# Patient Record
Sex: Female | Born: 1985 | Race: Black or African American | Hispanic: No | Marital: Single | Smoking: Never smoker
Health system: Southern US, Community
[De-identification: ages and names within clinical notes are randomized; demographics above are authoritative.]

## PROBLEM LIST (undated history)

## (undated) DIAGNOSIS — M79606 Pain in leg, unspecified: Secondary | ICD-10-CM

## (undated) DIAGNOSIS — M7989 Other specified soft tissue disorders: Secondary | ICD-10-CM

## (undated) DIAGNOSIS — I2699 Other pulmonary embolism without acute cor pulmonale: Secondary | ICD-10-CM

---

## 2017-08-10 ENCOUNTER — Emergency Department (HOSPITAL_COMMUNITY)
Admission: EM | Admit: 2017-08-10 | Discharge: 2017-08-10 | Disposition: A | Discharge status: Home / Self Care | Payer: Medicaid Other | Attending: Emergency Medicine | Admitting: Emergency Medicine

## 2017-08-10 ENCOUNTER — Emergency Department (HOSPITAL_BASED_OUTPATIENT_CLINIC_OR_DEPARTMENT_OTHER): Payer: Medicaid Other

## 2017-08-10 ENCOUNTER — Emergency Department (HOSPITAL_COMMUNITY)
Admission: EM | Admit: 2017-08-10 | Discharge: 2017-08-10 | Discharge status: Against Medical Advice | Payer: Medicaid Other | Source: Home / Self Care

## 2017-08-10 ENCOUNTER — Encounter (HOSPITAL_BASED_OUTPATIENT_CLINIC_OR_DEPARTMENT_OTHER): Payer: Self-pay

## 2017-08-10 ENCOUNTER — Encounter (HOSPITAL_COMMUNITY): Payer: Self-pay

## 2017-08-10 DIAGNOSIS — I2699 Other pulmonary embolism without acute cor pulmonale: Secondary | ICD-10-CM | POA: Insufficient documentation

## 2017-08-10 DIAGNOSIS — R2243 Localized swelling, mass and lump, lower limb, bilateral: Secondary | ICD-10-CM | POA: Diagnosis not present

## 2017-08-10 DIAGNOSIS — R0602 Shortness of breath: Secondary | ICD-10-CM | POA: Insufficient documentation

## 2017-08-10 DIAGNOSIS — R0789 Other chest pain: Secondary | ICD-10-CM | POA: Diagnosis present

## 2017-08-10 DIAGNOSIS — Z86711 Personal history of pulmonary embolism: Secondary | ICD-10-CM | POA: Diagnosis not present

## 2017-08-10 HISTORY — DX: Other pulmonary embolism without acute cor pulmonale: I26.99

## 2017-08-10 LAB — CBC WITH DIFFERENTIAL/PLATELET
BASOS ABS: 0 10*3/uL (ref 0.0–0.1)
BASOS PCT: 0 %
EOS ABS: 0.1 10*3/uL (ref 0.0–0.7)
Eosinophils Relative: 1 %
HEMATOCRIT: 39.1 % (ref 36.0–46.0)
HEMOGLOBIN: 13 g/dL (ref 12.0–15.0)
LYMPHS ABS: 1.4 10*3/uL (ref 0.7–4.0)
LYMPHS PCT: 23 %
MCH: 27.5 pg (ref 26.0–34.0)
MCHC: 33.2 g/dL (ref 30.0–36.0)
MCV: 82.7 fL (ref 78.0–100.0)
MONOS PCT: 12 %
Monocytes Absolute: 0.7 10*3/uL (ref 0.1–1.0)
NEUTROS PCT: 64 %
Neutro Abs: 3.9 10*3/uL (ref 1.7–7.7)
Platelets: 165 10*3/uL (ref 150–400)
RBC: 4.73 MIL/uL (ref 3.87–5.11)
RDW: 12.8 % (ref 11.5–15.5)
WBC: 6 10*3/uL (ref 4.0–10.5)

## 2017-08-10 LAB — ANTITHROMBIN III: AntiThromb III Func: 88 % (ref 75–120)

## 2017-08-10 LAB — COMPREHENSIVE METABOLIC PANEL
ALBUMIN: 3.8 g/dL (ref 3.5–5.0)
ALK PHOS: 62 U/L (ref 38–126)
ALT: 18 U/L (ref 14–54)
ANION GAP: 8 (ref 5–15)
AST: 24 U/L (ref 15–41)
BILIRUBIN TOTAL: 0.6 mg/dL (ref 0.3–1.2)
BUN: 9 mg/dL (ref 6–20)
CALCIUM: 9.1 mg/dL (ref 8.9–10.3)
CO2: 24 mmol/L (ref 22–32)
Chloride: 104 mmol/L (ref 101–111)
Creatinine, Ser: 0.63 mg/dL (ref 0.44–1.00)
GFR calc Af Amer: >60 mL/min (ref >60)
GFR calc non Af Amer: >60 mL/min (ref >60)
GLUCOSE: 99 mg/dL (ref 65–99)
Potassium: 3.7 mmol/L (ref 3.5–5.1)
SODIUM: 136 mmol/L (ref 135–145)
TOTAL PROTEIN: 8 g/dL (ref 6.5–8.1)

## 2017-08-10 LAB — TROPONIN I: Troponin I: <0.03 ng/mL (ref <0.03)

## 2017-08-10 MED ORDER — RIVAROXABAN (XARELTO) VTE STARTER PACK (15 & 20 MG): ORAL_TABLET | ORAL | 0 refills | Status: DC

## 2017-08-10 MED ORDER — IOPAMIDOL (ISOVUE-370) INJECTION 76%
100.0000 mL | Freq: Once | INTRAVENOUS | Status: AC | PRN
  Administered 2017-08-10: 100 mL via INTRAVENOUS

## 2017-08-10 MED FILL — XARELTO STARTER PACK: 15 & 20 | 28 days supply | Qty: 51 | Fill #0

## 2017-08-10 NOTE — ED Triage Notes (Signed)
C/o CP-pain worse with dep breath-reports hx of PE

## 2017-08-10 NOTE — Discharge Instructions (Signed)
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 Return for worsening shortness of breath or difficulty breathing. Take blood thinners. Follow-up with primary care and hematology to figure out why you have had recurrent pulmonary embolisms.

## 2017-08-10 NOTE — ED Notes (Signed)
ED Provider at bedside. 

## 2017-08-10 NOTE — ED Triage Notes (Signed)
Pt states that she was seen today at [place] and diagnosed with bilateral PE's and then sent home without blood thinners. Pt unhappy with care and came here.

## 2017-08-10 NOTE — ED Notes (Signed)
Pt ambulated by RN Sue Lush

## 2017-08-10 NOTE — ED Provider Notes (Signed)
[facility]   CSN: [id number] Arrival date & time: 08/10/17  1524     History   Chief Complaint Chief Complaint  Patient presents with  . Chest Pain    HPI Christie Hernandez is a 31 y.o. female.  HPI Patient presents with left anterior chest pain with shortness of breath.Began 2 days ago. More short of breath than normal. Has had some difficulty walking because she gets short of breath. Has history of pulmonary embolisms 2 years ago. States that that was more severe than this. She was on anticoagulation for 6 months and then stop. Did not know why she had the blood clot. No family members have had pulmonary embolism. She does not smoke. She is not on anticoagulation. She had not had recent surgery. She has some swelling in both her legs and states this is not unusual. It is also worse with certain movements. Past Medical History:  Diagnosis Date  . Pulmonary emboli (HCC)     There are no active problems to display for this patient.   History reviewed. No pertinent surgical history.  OB History    No data available       Home Medications    Prior to Admission medications   Medication Sig Start Date End Date Taking? Authorizing Provider  Rivaroxaban 15 & 20 MG TBPK Take as directed on package: Start with one  tablet by mouth twice a day with food. On Day 22, switch to one  tablet once a day with food. 08/10/17   Benjiman Core, MD    Family History No family history on file.  Social History Social History  Substance Use Topics  . Smoking status: Never Smoker  . Smokeless tobacco: Never Used  . Alcohol use No     Allergies   Patient has no known allergies.   Review of Systems Review of Systems  Constitutional: Negative for appetite change.  HENT: Negative for congestion.   Respiratory: Positive for shortness of breath. Negative for cough.   Cardiovascular: Positive for chest pain and leg swelling.  Gastrointestinal:  Negative for abdominal pain.  Endocrine: Negative for polyuria.  Genitourinary: Negative for flank pain.  Musculoskeletal: Negative for back pain.  Neurological: Negative for weakness and numbness.  Hematological: Negative for adenopathy.  Psychiatric/Behavioral: Negative for confusion.     Physical Exam Updated Vital Signs BP (!) 162/99 (BP Location: Right Arm)   Temp 98.3 F (36.8 C) (Oral)   Wt (!) 165.6 kg (365 lb)   LMP 07/19/2017   Physical Exam  Constitutional:  Patient is morbidly obese  HENT:  Head: Atraumatic.  Eyes: Pupils are equal, round, and reactive to light.  Neck: Neck supple.  Cardiovascular: Normal rate.   Pulmonary/Chest: She exhibits tenderness.  Tenderness to left anterior chest wall. No rash or deformity. No crepitance.  Abdominal: There is no tenderness.  Musculoskeletal: She exhibits edema.  Pitting edema bilateral lower extremities. Appears symmetric  Skin: Skin is warm.  Psychiatric: She has a normal mood and affect.     ED Treatments / Results  Labs (all labs ordered are listed, but only abnormal results are displayed) Labs Reviewed  CBC WITH DIFFERENTIAL/PLATELET  COMPREHENSIVE METABOLIC PANEL  TROPONIN I  ANTITHROMBIN III  PROTEIN C ACTIVITY  PROTEIN C, TOTAL  PROTEIN S ACTIVITY  PROTEIN S, TOTAL  LUPUS ANTICOAGULANT PANEL  BETA-2-GLYCOPROTEIN I ABS, IGG/M/A  HOMOCYSTEINE  FACTOR 5 LEIDEN  PROTHROMBIN GENE MUTATION  CARDIOLIPIN ANTIBODIES, IGG, IGM, IGA  EKG  EKG Interpretation  Date/Time:  Thursday August 10 2017 15:38:13 EDT Ventricular Rate:  90 PR Interval:    QRS Duration: 98 QT Interval:  364 QTC Calculation: 446 R Axis:   80 Text Interpretation:  Sinus rhythm Borderline Q waves in inferior leads Borderline T abnormalities, inferior leads [id number] Reconfirmed by Benjiman Core [id number]) on 08/10/2017 3:44:16 PM       Radiology Dg Chest 2 View  Result Date: 08/10/2017 CLINICAL DATA:  Shortness of Breath  EXAM: CHEST  2 VIEW COMPARISON:  None. FINDINGS: There is a small left pleural effusion. Lungs elsewhere are clear. Heart is mildly enlarged with pulmonary vascularity within normal limits. No adenopathy. No bone lesions. IMPRESSION: Small left pleural effusion. No edema or consolidation. Heart mildly enlarged. Electronically Signed   By: Bretta Bang III M.D.   On: 08/10/2017 15:59   Ct Angio Chest Pe W And/or Wo Contrast  Result Date: 08/10/2017 CLINICAL DATA:  31 year old presenting with 1 day history of mid chest pain. Personal history of pulmonary embolism by report. High clinical pretest probability of pulmonary embolism. EXAM: CT ANGIOGRAPHY CHEST WITH CONTRAST TECHNIQUE: Multidetector CT imaging of the chest was performed using the standard protocol during bolus administration of intravenous contrast. Multiplanar CT image reconstructions and MIPs were obtained to evaluate the vascular anatomy. CONTRAST:  100 mL Isovue 370 IV. COMPARISON:  None. FINDINGS: Cardiovascular: Contrast opacification of the pulmonary arteries is moderate. Abundant image noise is present due to body habitus. Overall, the study is of moderate diagnostic quality. Filling defects within the left main pulmonary artery and the proximal left lower lobe pulmonary artery. Filling defect and the right middle lobe pulmonary artery. No visible filling defects elsewhere, though the peripheral pulmonary arteries are not optimally visualized. RV/LV ratio measures 0.9, the upper limits of normal. Heart mildly enlarged. Mild left ventricular hypertrophy. No pericardial effusion. Mediastinum/Nodes: No pathologically enlarged mediastinal, hilar or axillary lymph nodes. No mediastinal masses. Normal-appearing esophagus. Normal-appearing thyroid gland. Lungs/Pleura: Very low lung volumes with atelectasis in the lower lobes, left greater than right and in the lingula. No confluent airspace consolidation. Small left pleural effusion. No right  pleural effusion. Central airways patent. Upper Abdomen: Unremarkable for the early arterial phase of enhancement. Musculoskeletal: Regional skeleton intact without acute or significant osseous abnormality. Review of the MIP images confirms the above findings. IMPRESSION: 1. Acute pulmonary emboli involving the distal left main pulmonary artery, left lower lobe pulmonary artery, and the right middle lobe pulmonary artery. Clot burden is small. 2. No evidence of right heart strain. 3. Low lung volumes with atelectasis in the lower lobes and in the lingula. Small left pleural effusion. No acute cardiopulmonary disease otherwise. I telephoned these results at the time of interpretation on 08/10/2017 at 4:50 pm to Dr. Benjiman Core, who verbally acknowledged these results. Electronically Signed   By: Hulan Saas M.D.   On: 08/10/2017 16:52    Procedures Procedures (including critical care time)  Medications Ordered in ED Medications  iopamidol (ISOVUE-370) 76 % injection 100 mL (100 mLs Intravenous Contrast Given 08/10/17 1619)     Initial Impression / Assessment and Plan / ED Course  I have reviewed the triage vital signs and the nursing notes.  Pertinent labs & imaging results that were available during my care of the patient were reviewed by me and considered in my medical decision making (see chart for details).     Patient with pulmonary embolism. History of same. Not hypoxic. Will ambulate without hypoxia.  Small pulmonary embolisms without strain. Discussed with pharmacy and will start Xarelto. will need follow-up with primary care doctor and hematology. Did not feel that she needs admission at this time and patient states she could not come in because of her child.  Final Clinical Impressions(s) / ED Diagnoses   Final diagnoses:  Other acute pulmonary embolism without acute cor pulmonale (HCC)    New Prescriptions Discharge Medication List as of 08/10/2017  5:16 PM    START  taking these medications   Details  Rivaroxaban 15 & 20 MG TBPK Take as directed on package: Start with one  tablet by mouth twice a day with food. On Day 22, switch to one  tablet once a day with food., Print         Benjiman Core, MD 08/10/17 [phone]

## 2017-08-10 NOTE — ED Notes (Signed)
Pt called in waiting room to go back to E41 no answer. Triage tech called pt also and still no answer.

## 2017-08-11 DIAGNOSIS — I10 Essential (primary) hypertension: Secondary | ICD-10-CM | POA: Insufficient documentation

## 2017-08-11 LAB — LUPUS ANTICOAGULANT PANEL
DRVVT: 40.9 s (ref 0.0–47.0)
PTT LA: 26.6 s (ref 0.0–51.9)

## 2017-08-11 LAB — PROTEIN S ACTIVITY: PROTEIN S ACTIVITY: 44 % — AB (ref 63–140)

## 2017-08-11 LAB — BETA-2-GLYCOPROTEIN I ABS, IGG/M/A
Beta-2 Glyco I IgG: <9 GPI IgG units (ref 0–20)
Beta-2-Glycoprotein I IgA: <9 GPI IgA units (ref 0–25)

## 2017-08-11 LAB — PROTEIN C ACTIVITY: PROTEIN C ACTIVITY: 124 % (ref 73–180)

## 2017-08-11 LAB — PROTEIN S, TOTAL: PROTEIN S AG TOTAL: 66 % (ref 60–150)

## 2017-08-12 LAB — PROTEIN C, TOTAL: Protein C, Total: 115 % (ref 60–150)

## 2017-08-14 ENCOUNTER — Other Ambulatory Visit (HOSPITAL_COMMUNITY)
Admission: RE | Admit: 2017-08-14 | Discharge: 2017-08-14 | Disposition: A | Discharge status: Home / Self Care | Payer: Medicaid Other | Source: Ambulatory Visit | Attending: Obstetrics & Gynecology | Admitting: Obstetrics & Gynecology

## 2017-08-14 ENCOUNTER — Ambulatory Visit (INDEPENDENT_AMBULATORY_CARE_PROVIDER_SITE_OTHER): Payer: Medicaid Other | Admitting: Obstetrics & Gynecology

## 2017-08-14 ENCOUNTER — Encounter: Payer: Self-pay | Admitting: Obstetrics & Gynecology

## 2017-08-14 VITALS — BP 140/84 | HR 70 | Ht 64.0 in | Wt 361.0 lb

## 2017-08-14 DIAGNOSIS — Z0000 Encounter for general adult medical examination without abnormal findings: Secondary | ICD-10-CM

## 2017-08-14 DIAGNOSIS — N898 Other specified noninflammatory disorders of vagina: Secondary | ICD-10-CM | POA: Diagnosis present

## 2017-08-14 DIAGNOSIS — Z124 Encounter for screening for malignant neoplasm of cervix: Secondary | ICD-10-CM | POA: Diagnosis not present

## 2017-08-14 DIAGNOSIS — Z1151 Encounter for screening for human papillomavirus (HPV): Secondary | ICD-10-CM | POA: Diagnosis not present

## 2017-08-14 DIAGNOSIS — Z23 Encounter for immunization: Secondary | ICD-10-CM

## 2017-08-14 DIAGNOSIS — Z113 Encounter for screening for infections with a predominantly sexual mode of transmission: Secondary | ICD-10-CM

## 2017-08-14 LAB — CARDIOLIPIN ANTIBODIES, IGG, IGM, IGA
Anticardiolipin IgA: <9 APL U/mL (ref 0–11)
Anticardiolipin IgM: <9 MPL U/mL (ref 0–12)

## 2017-08-14 LAB — HOMOCYSTEINE: Homocysteine: 6.5 umol/L (ref 0.0–15.0)

## 2017-08-14 NOTE — Progress Notes (Signed)
   Subjective:    Patient ID: Christie Hernandez, female    DOB: 07/26/1986, 31 y.o.   MRN: [id number]  HPI 31 yoS AA P48 (46 year old son) here today for a pap smear and with the issue of vaginal discharge for about a week. It has an odor. She has had BV in the past.  She has been monogamous for about 6 months.   Review of Systems Last pap about 18 months ago in [place] "normal". She had a PE recently and was started on "blood thinner". 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

## 2017-08-14 NOTE — Addendum Note (Signed)
Addended by: Allie Bossier on: 08/14/2017 11:49 AM   Modules accepted: Orders

## 2017-08-14 NOTE — Progress Notes (Signed)
vagina

## 2017-08-15 LAB — FACTOR 5 LEIDEN

## 2017-08-16 LAB — CYTOLOGY - PAP
BACTERIAL VAGINITIS: NEGATIVE
CANDIDA VAGINITIS: POSITIVE — AB
CHLAMYDIA, DNA PROBE: NEGATIVE
DIAGNOSIS: NEGATIVE
Neisseria Gonorrhea: NEGATIVE
Trichomonas: NEGATIVE

## 2017-08-16 LAB — PROTHROMBIN GENE MUTATION

## 2017-08-21 ENCOUNTER — Other Ambulatory Visit: Payer: Self-pay

## 2017-08-23 ENCOUNTER — Telehealth: Payer: Self-pay | Admitting: General Practice

## 2017-08-23 DIAGNOSIS — B379 Candidiasis, unspecified: Secondary | ICD-10-CM

## 2017-08-23 MED ORDER — FLUCONAZOLE 150 MG PO TABS: 150.0000 mg | ORAL_TABLET | Freq: Once | ORAL | 0 refills | Status: DC

## 2017-08-23 NOTE — Telephone Encounter (Signed)
Patient called and left message requesting pap smear results. States she is having pain. Per chart review, pap shows yeast infection. Med ordered per protocol. Called patient, no answer- left message stating we are trying to reach you to return your phone call, please call us back

## 2017-08-24 NOTE — Telephone Encounter (Signed)
Pt aware of results and that medication was sent to her pharmacy

## 2017-08-24 NOTE — Telephone Encounter (Signed)
Called pt and left vm for her to return my call

## 2017-08-29 ENCOUNTER — Emergency Department (HOSPITAL_BASED_OUTPATIENT_CLINIC_OR_DEPARTMENT_OTHER)
Admission: EM | Admit: 2017-08-29 | Discharge: 2017-08-29 | Disposition: A | Discharge status: Home / Self Care | Payer: Medicaid Other | Attending: Emergency Medicine | Admitting: Emergency Medicine

## 2017-08-29 ENCOUNTER — Emergency Department (HOSPITAL_BASED_OUTPATIENT_CLINIC_OR_DEPARTMENT_OTHER): Payer: Medicaid Other

## 2017-08-29 ENCOUNTER — Encounter (HOSPITAL_BASED_OUTPATIENT_CLINIC_OR_DEPARTMENT_OTHER): Payer: Self-pay | Admitting: Emergency Medicine

## 2017-08-29 DIAGNOSIS — I2699 Other pulmonary embolism without acute cor pulmonale: Secondary | ICD-10-CM | POA: Insufficient documentation

## 2017-08-29 DIAGNOSIS — I517 Cardiomegaly: Secondary | ICD-10-CM | POA: Diagnosis not present

## 2017-08-29 DIAGNOSIS — R079 Chest pain, unspecified: Secondary | ICD-10-CM

## 2017-08-29 DIAGNOSIS — Z7901 Long term (current) use of anticoagulants: Secondary | ICD-10-CM | POA: Insufficient documentation

## 2017-08-29 HISTORY — DX: Pain in leg, unspecified: M79.606

## 2017-08-29 HISTORY — DX: Pain in leg, unspecified: M79.89

## 2017-08-29 LAB — CBC WITH DIFFERENTIAL/PLATELET
Basophils Absolute: 0 10*3/uL (ref 0.0–0.1)
Basophils Relative: 0 %
EOS PCT: 1 %
Eosinophils Absolute: 0.1 10*3/uL (ref 0.0–0.7)
HEMATOCRIT: 36.9 % (ref 36.0–46.0)
Hemoglobin: 12.1 g/dL (ref 12.0–15.0)
LYMPHS ABS: 1.1 10*3/uL (ref 0.7–4.0)
LYMPHS PCT: 26 %
MCH: 27.3 pg (ref 26.0–34.0)
MCHC: 32.8 g/dL (ref 30.0–36.0)
MCV: 83.3 fL (ref 78.0–100.0)
MONO ABS: 0.5 10*3/uL (ref 0.1–1.0)
Monocytes Relative: 12 %
Neutro Abs: 2.6 10*3/uL (ref 1.7–7.7)
Neutrophils Relative %: 61 %
PLATELETS: 230 10*3/uL (ref 150–400)
RBC: 4.43 MIL/uL (ref 3.87–5.11)
RDW: 13.1 % (ref 11.5–15.5)
WBC: 4.2 10*3/uL (ref 4.0–10.5)

## 2017-08-29 LAB — BASIC METABOLIC PANEL
Anion gap: 5 (ref 5–15)
BUN: 11 mg/dL (ref 6–20)
CO2: 26 mmol/L (ref 22–32)
Calcium: 8.8 mg/dL — ABNORMAL LOW (ref 8.9–10.3)
Chloride: 106 mmol/L (ref 101–111)
Creatinine, Ser: 0.68 mg/dL (ref 0.44–1.00)
GFR calc Af Amer: >60 mL/min (ref >60)
GLUCOSE: 115 mg/dL — AB (ref 65–99)
POTASSIUM: 3.7 mmol/L (ref 3.5–5.1)
Sodium: 137 mmol/L (ref 135–145)

## 2017-08-29 LAB — TROPONIN I: Troponin I: <0.03 ng/mL (ref <0.03)

## 2017-08-29 NOTE — ED Provider Notes (Signed)
[facility] EMERGENCY DEPARTMENT Provider Note   CSN: [id number] Arrival date & time: 08/29/17  9147     History   Chief Complaint Chief Complaint  Patient presents with  . Chest Pain    HPI Christie Hernandez is a 31 y.o. female.  Patient with previous history of pulmonary emboli, most recent episode diagnosed on 08/10/17, currently taking Xarelto -- presents with two-day history of chest pain and pressure, intermittent mild shortness of breath, with mild generalized headache. Patient states her symptoms are similar to when she was previously diagnosed with PE. She states compliance with her medications and has not missed any doses. No medications take for HA. She has not had any worsening lower extremity swelling. She has not had to stop any activities due to shortness of breath. No fevers or cough. No vomiting or diarrhea. No abdominal pain or urinary symptoms. Patient has not noticed any increased bleeding and states that she feels she is tolerating anticoagulation well. She does not currently have a primary care physician. 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 Patient has never seen a hematologist. Course is constant. Nothing makes symptoms better or worse.      Past Medical History:  Diagnosis Date  . Pain and swelling of lower extremity   . Pulmonary emboli (HCC)     There are no active problems to display for this patient.   History reviewed. No pertinent surgical history.  OB History    No data available       Home Medications    Prior to Admission medications   Medication Sig Start Date End Date Taking? Authorizing Provider  Rivaroxaban 15 & 20 MG TBPK Take as directed on package: Start with one  tablet by mouth twice a day with food. On Day 22, switch to one  tablet once a day with food. 08/10/17   Benjiman Core, MD    Family History No family history on file.  Social History Social History  Substance Use  Topics  . Smoking status: Never Smoker  . Smokeless tobacco: Never Used  . Alcohol use No     Allergies   Patient has no known allergies.   Review of Systems Review of Systems  Constitutional: Negative for fever.  HENT: Negative for rhinorrhea and sore throat.   Eyes: Negative for redness.  Respiratory: Positive for shortness of breath. Negative for cough.   Cardiovascular: Positive for chest pain.  Gastrointestinal: Negative for abdominal pain, diarrhea, nausea and vomiting.  Genitourinary: Negative for dysuria.  Musculoskeletal: Negative for myalgias.  Skin: Negative for rash.  Neurological: Positive for headaches.     Physical Exam Updated Vital Signs BP (!) 152/96   Pulse 60   Temp 97.9 F (36.6 C) (Oral)   Resp (!) 23   Ht  (1.626 m)   Wt (!) 166 kg (366 lb)   LMP 08/29/2017   SpO2 100%   BMI 62.82 kg/m   Physical Exam  Constitutional: She appears well-developed and well-nourished.  HENT:  Head: Normocephalic and atraumatic.  Mouth/Throat: Oropharynx is clear and moist.  Eyes: Conjunctivae are normal. Right eye exhibits no discharge. Left eye exhibits no discharge.  Neck: Normal range of motion. Neck supple.  Cardiovascular: Normal rate, regular rhythm and normal heart sounds.   No murmur heard. Pulmonary/Chest: Effort normal and breath sounds normal. No respiratory distress. She has no wheezes. She has no rales.  Abdominal: Soft. There is no tenderness.  Musculoskeletal: She exhibits  edema.  No clinical s/s of DVT.   Neurological: She is alert.  Skin: Skin is warm and dry.  Psychiatric: Her mood appears anxious.  Nursing note and vitals reviewed.    ED Treatments / Results  Labs (all labs ordered are listed, but only abnormal results are displayed) Labs Reviewed  BASIC METABOLIC PANEL - Abnormal; Notable for the following:       Result Value   Glucose, Bld 115 (*)    Calcium 8.8 (*)    All other components within normal limits  CBC WITH  DIFFERENTIAL/PLATELET  TROPONIN I    EKG  EKG Interpretation  Date/Time:  Tuesday August 29 2017 09:46:46 EDT Ventricular Rate:  67 PR Interval:    QRS Duration: 111 QT Interval:  412 QTC Calculation: 435 R Axis:   79 Text Interpretation:  Sinus rhythm Since last tracing rate slower flipped t waves in diffuse leads resolved from prior likely were rate related Confirmed by Melene Plan [phone]) on 08/29/2017 9:51:07 AM       Radiology Dg Chest 2 View  Result Date: 08/29/2017 CLINICAL DATA:  Pulmonary embolus.  Chest pain. EXAM: CHEST  2 VIEW COMPARISON:  CT 08/10/2017.  Chest x-ray 08/10/2017 . FINDINGS: Mediastinum and hilar structures normal. Cardiomegaly with normal pulmonary vascularity. No focal infiltrate . Small left pleural effusion cannot be excluded. No pneumothorax . IMPRESSION: 1. Cardiomegaly.  Normal pulmonary vascularity. 2. Small left pleural effusion cannot be excluded. Electronically Signed   By: Maisie Fus  Register   On: 08/29/2017 11:12    Procedures Procedures (including critical care time)  Medications Ordered in ED Medications - No data to display   Initial Impression / Assessment and Plan / ED Course  I have reviewed the triage vital signs and the nursing notes.  Pertinent labs & imaging results that were available during my care of the patient were reviewed by me and considered in my medical decision making (see chart for details).     Patient seen and examined. Work-up initiated.   Vital signs reviewed and are as follows: BP (!) 152/96   Pulse 60   Temp 97.9 F (36.6 C) (Oral)   Resp (!) 23   Ht  (1.626 m)   Wt (!) 166 kg (366 lb)   LMP 08/29/2017   SpO2 100%   BMI 62.82 kg/m   11:50 AM Patient updated on results. Encouraged her to continue taking anticoagulation as prescribed. She is given primary care referral as well as hematology/oncology referral. She is encouraged to follow-up with both.  Patient was counseled to return with  severe chest pain, especially if the pain is crushing or pressure-like and spreads to the arms, back, neck, or jaw, or if they have sweating, nausea, or shortness of breath with the pain. They were encouraged to call 911 with these symptoms.    Final Clinical Impressions(s) / ED Diagnoses   Final diagnoses:  Chest pain, unspecified type  Other pulmonary embolism without acute cor pulmonale, unspecified chronicity (HCC)  Cardiomegaly   Patient with nonspecific chest pain in setting of recent PE. Patient is on anticoagulation without missing doses. She has normal vital signs here today. She has an EKG with signs of heart strain, although unchanged from previous. Chest x-ray without significant pulmonary edema. Patient informed of cardiomegaly and encouraged follow-up with PCP for echocardiography. Troponin is negative. Patient is not hypoxic. Strongly encouraged establishing routine PCP care and she is also given referral to hematology given recurrent PE with abnormal  protein S test for long-term management recommendations. I do not suspect ACS. I do not suspect worsening PE. Patient appears well but anxious.  New Prescriptions New Prescriptions   No medications on file     Renne Crigler, Cordelia Poche 08/29/17 1153    Melene Plan, DO 08/29/17 1325

## 2017-08-29 NOTE — ED Triage Notes (Signed)
Pt states she is having new onset left sided chest pressure.  Pt recently diagnosed with PE and is taking xarelto.  Pt states she doesn't wish to see her physician again, she is looking for a new MD.  No sob, no new leg pain, pt has some leg pain and swelling chronic.

## 2017-08-29 NOTE — Discharge Instructions (Signed)
Please read and follow all provided instructions.  Your diagnoses today include:  1. Chest pain, unspecified type   2. Other pulmonary embolism without acute cor pulmonale, unspecified chronicity (HCC)   3. Cardiomegaly     Tests performed today include:  An EKG of your heart  A chest x-ray - shows possible enlarged heart, no significant fluid in lungs  Cardiac enzymes - a blood test for heart muscle damage, no stress on heart  Blood counts and electrolytes  Vital signs. See below for your results today.   Medications prescribed:   None  Take any prescribed medications only as directed.  Follow-up instructions: Please follow-up with your primary care provider as soon as you can for further evaluation of your symptoms.   Also see the hematologist (blood clot doctor) listed for evaluation of your recurrent PE's for long-term treatment decisions.  Return instructions:  SEEK IMMEDIATE MEDICAL ATTENTION IF:  You have severe chest pain, especially if the pain is crushing or pressure-like and spreads to the arms, back, neck, or jaw, or if you have sweating, nausea (feeling sick to your stomach), or shortness of breath. THIS IS AN EMERGENCY. Don't wait to see if the pain will go away. Get medical help at once. Call 911 or 0 (operator). DO NOT drive yourself to the hospital.   Your chest pain gets worse and does not go away with rest.   You have an attack of chest pain lasting longer than usual, despite rest and treatment with the medications your caregiver has prescribed.   You wake from sleep with chest pain or shortness of breath.  You feel dizzy or faint.  You have chest pain not typical of your usual pain for which you originally saw your caregiver.   You have any other emergent concerns regarding your health.  Additional Information: Chest pain comes from many different causes. Your caregiver has diagnosed you as having chest pain that is not specific for one problem,  but does not require admission.  You are at low risk for an acute heart condition or other serious illness.   Your vital signs today were: BP (!) 152/96    Pulse 60    Temp 97.9 F (36.6 C) (Oral)    Resp (!) 23    Ht  (1.626 m)    Wt (!) 166 kg (366 lb)    LMP 08/29/2017    SpO2 100%    BMI 62.82 kg/m  If your blood pressure (BP) was elevated above 135/85 this visit, please have this repeated by your doctor within one month. --------------

## 2017-08-29 NOTE — ED Notes (Signed)
Pt given resource guide and work note

## 2017-09-26 ENCOUNTER — Other Ambulatory Visit: Payer: Self-pay | Admitting: Obstetrics & Gynecology

## 2017-09-26 DIAGNOSIS — B379 Candidiasis, unspecified: Secondary | ICD-10-CM

## 2017-12-05 ENCOUNTER — Other Ambulatory Visit: Payer: Self-pay

## 2017-12-05 ENCOUNTER — Emergency Department (HOSPITAL_BASED_OUTPATIENT_CLINIC_OR_DEPARTMENT_OTHER): Payer: Medicaid Other

## 2017-12-05 ENCOUNTER — Emergency Department (HOSPITAL_BASED_OUTPATIENT_CLINIC_OR_DEPARTMENT_OTHER)
Admission: EM | Admit: 2017-12-05 | Discharge: 2017-12-05 | Disposition: A | Discharge status: Home / Self Care | Payer: Medicaid Other | Attending: Emergency Medicine | Admitting: Emergency Medicine

## 2017-12-05 ENCOUNTER — Encounter (HOSPITAL_BASED_OUTPATIENT_CLINIC_OR_DEPARTMENT_OTHER): Payer: Self-pay

## 2017-12-05 DIAGNOSIS — R0789 Other chest pain: Secondary | ICD-10-CM | POA: Diagnosis not present

## 2017-12-05 DIAGNOSIS — Z86711 Personal history of pulmonary embolism: Secondary | ICD-10-CM | POA: Diagnosis not present

## 2017-12-05 DIAGNOSIS — R079 Chest pain, unspecified: Secondary | ICD-10-CM

## 2017-12-05 DIAGNOSIS — Z7901 Long term (current) use of anticoagulants: Secondary | ICD-10-CM | POA: Diagnosis not present

## 2017-12-05 LAB — CBC WITH DIFFERENTIAL/PLATELET
Basophils Absolute: 0 10*3/uL (ref 0.0–0.1)
Basophils Relative: 0 %
Eosinophils Absolute: 0.1 10*3/uL (ref 0.0–0.7)
Eosinophils Relative: 1 %
HEMATOCRIT: 38.4 % (ref 36.0–46.0)
HEMOGLOBIN: 12.6 g/dL (ref 12.0–15.0)
LYMPHS ABS: 1.4 10*3/uL (ref 0.7–4.0)
Lymphocytes Relative: 25 %
MCH: 27.1 pg (ref 26.0–34.0)
MCHC: 32.8 g/dL (ref 30.0–36.0)
MCV: 82.6 fL (ref 78.0–100.0)
MONOS PCT: 9 %
Monocytes Absolute: 0.5 10*3/uL (ref 0.1–1.0)
NEUTROS ABS: 3.7 10*3/uL (ref 1.7–7.7)
Neutrophils Relative %: 65 %
Platelets: 233 10*3/uL (ref 150–400)
RBC: 4.65 MIL/uL (ref 3.87–5.11)
RDW: 13.7 % (ref 11.5–15.5)
WBC: 5.7 10*3/uL (ref 4.0–10.5)

## 2017-12-05 LAB — COMPREHENSIVE METABOLIC PANEL
ALBUMIN: 3.7 g/dL (ref 3.5–5.0)
ALK PHOS: 59 U/L (ref 38–126)
ALT: 15 U/L (ref 14–54)
ANION GAP: 9 (ref 5–15)
AST: 23 U/L (ref 15–41)
BILIRUBIN TOTAL: 0.5 mg/dL (ref 0.3–1.2)
BUN: 15 mg/dL (ref 6–20)
CALCIUM: 9.1 mg/dL (ref 8.9–10.3)
CO2: 26 mmol/L (ref 22–32)
Chloride: 102 mmol/L (ref 101–111)
Creatinine, Ser: 0.63 mg/dL (ref 0.44–1.00)
GFR calc non Af Amer: >60 mL/min (ref >60)
GLUCOSE: 103 mg/dL — AB (ref 65–99)
Potassium: 3.3 mmol/L — ABNORMAL LOW (ref 3.5–5.1)
Sodium: 137 mmol/L (ref 135–145)
TOTAL PROTEIN: 8.5 g/dL — AB (ref 6.5–8.1)

## 2017-12-05 LAB — TROPONIN I: Troponin I: <0.03 ng/mL (ref <0.03)

## 2017-12-05 MED ORDER — IOPAMIDOL (ISOVUE-370) INJECTION 76%
125.0000 mL | Freq: Once | INTRAVENOUS | Status: AC | PRN
  Administered 2017-12-05: 125 mL via INTRAVENOUS

## 2017-12-05 NOTE — ED Provider Notes (Addendum)
[facility] EMERGENCY DEPARTMENT Provider Note   CSN: [id number] Arrival date & time: 12/05/17  1943     History   Chief Complaint Chief Complaint  Patient presents with  . Chest Pain    HPI Christie Hernandez is a 32 y.o. female.  Complaint is chest pain.  History of PE.  HPI 31-year female.  History of previous PE x2.  Protein C deficiency.  Compliant with Xarelto per her report.  Morbid obesity.  SPE was September of last year.  Continues on blood thinners.  States she is compliant.  Developed some left-sided chest pain early this morning.  Sharp.  "Feels like my blood clot".  Is not had cough, fever, or other systemic symptoms.  Past Medical History:  Diagnosis Date  . Pain and swelling of lower extremity   . Pulmonary emboli (HCC)     There are no active problems to display for this patient.   History reviewed. No pertinent surgical history.  OB History    No data available       Home Medications    Prior to Admission medications   Medication Sig Start Date End Date Taking? Authorizing Provider  Apixaban (ELIQUIS PO) Take by mouth.   Yes [provider]    Family History No family history on file.  Social History Social History   Tobacco Use  . Smoking status: Never Smoker  . Smokeless tobacco: Never Used  Substance Use Topics  . Alcohol use: No  . Drug use: No     Allergies   Patient has no known allergies.   Review of Systems Review of Systems  Constitutional: Negative for appetite change, chills, diaphoresis, fatigue and fever.  HENT: Negative for mouth sores, sore throat and trouble swallowing.   Eyes: Negative for visual disturbance.  Respiratory: Negative for cough, chest tightness, shortness of breath and wheezing.   Cardiovascular: Positive for chest pain.  Gastrointestinal: Negative for abdominal distention, abdominal pain, diarrhea, nausea and vomiting.  Endocrine: Negative for polydipsia, polyphagia and  polyuria.  Genitourinary: Negative for dysuria, frequency and hematuria.  Musculoskeletal: Negative for gait problem.  Skin: Negative for color change, pallor and rash.  Neurological: Negative for dizziness, syncope, light-headedness and headaches.  Hematological: Does not bruise/bleed easily.  Psychiatric/Behavioral: Negative for behavioral problems and confusion.     Physical Exam Updated Vital Signs BP (!) 155/99   Pulse 84   Temp 98.6 F (37 C) (Oral)   Resp (!) 23   Ht 5\' 4"  (1.626 m)   Wt (!) 165.3 kg (364 lb 6.7 oz)   LMP 11/08/2017   SpO2 100%   BMI 62.55 kg/m   Physical Exam  Constitutional: She is oriented to person, place, and time. She appears well-developed and well-nourished. No distress.  Obese. NAD.  HENT:  Head: Normocephalic.  Eyes: Pupils are equal, round, and reactive to light. Conjunctivae are normal. No scleral icterus.  Neck: Normal range of motion. Neck supple. No thyromegaly present.  Cardiovascular: Normal rate and regular rhythm. Exam reveals no gallop and no friction rub.  No murmur heard. Not tachycardic.  No JVD or bruits.  Pulmonary/Chest: Effort normal and breath sounds normal. No respiratory distress. She has no wheezes. She has no rales.  Abdominal: Soft. Bowel sounds are normal. She exhibits no distension. There is no tenderness. There is no rebound.  Musculoskeletal: Normal range of motion.  Neurological: She is alert and oriented to person, place, and time.  Skin: Skin is warm and dry.  No rash noted.  Psychiatric: She has a normal mood and affect. Her behavior is normal.     ED Treatments / Results  Labs (all labs ordered are listed, but only abnormal results are displayed) Labs Reviewed  COMPREHENSIVE METABOLIC PANEL - Abnormal; Notable for the following components:      Result Value   Potassium 3.3 (*)    Glucose, Bld 103 (*)    Total Protein 8.5 (*)    All other components within normal limits  TROPONIN I  CBC WITH  DIFFERENTIAL/PLATELET  PREGNANCY, URINE    EKG  EKG Interpretation None       Radiology Dg Chest 2 View  Result Date: 12/05/2017 CLINICAL DATA:  Chest pain and shortness of breath EXAM: CHEST  2 VIEW COMPARISON:  August 29, 2017 FINDINGS: The lungs are clear. Heart is upper normal in size with pulmonary vascularity within normal limits. No adenopathy. No evident bone lesions. No pneumothorax. IMPRESSION: No edema or consolidation. Electronically Signed   By: Bretta Bang III M.D.   On: 12/05/2017 20:29   Ct Angio Chest Pe W And/or Wo Contrast  Result Date: 12/05/2017 CLINICAL DATA:  Sternal chest pain with shortness of breath EXAM: CT ANGIOGRAPHY CHEST WITH CONTRAST TECHNIQUE: Multidetector CT imaging of the chest was performed using the standard protocol during bolus administration of intravenous contrast. Multiplanar CT image reconstructions and MIPs were obtained to evaluate the vascular anatomy. CONTRAST:  ISOVUE-370 IOPAMIDOL (ISOVUE-370) INJECTION 76% COMPARISON:  Radiograph 12/05/2017, CT chest 08/11/2007 FINDINGS: Cardiovascular: Satisfactory opacification of the pulmonary arteries to the segmental level. No evidence of pulmonary embolism. Normal heart size. No pericardial effusion. Nonaneurysmal aorta. Mediastinum/Nodes: No enlarged mediastinal, hilar, or axillary lymph nodes. Thyroid gland, trachea, and esophagus demonstrate no significant findings. Stable mildly prominent axillary lymph nodes. Lungs/Pleura: Lungs are clear. No pleural effusion or pneumothorax. Upper Abdomen: No acute abnormality. Musculoskeletal: No chest wall abnormality. No acute or significant osseous findings. Review of the MIP images confirms the above findings. IMPRESSION: 1. Negative for acute pulmonary embolus 2. Clear lung fields Electronically Signed   By: Jasmine Pang M.D.   On: 12/05/2017 22:02    Procedures Procedures (including critical care time)  Medications Ordered in ED Medications    iopamidol (ISOVUE-370) 76 % injection 125 mL (125 mLs Intravenous Contrast Given 12/05/17 2134)     Initial Impression / Assessment and Plan / ED Course  I have reviewed the triage vital signs and the nursing notes.  Pertinent labs & imaging results that were available during my care of the patient were reviewed by me and considered in my medical decision making (see chart for details).     CT negative.  Plan Tylenol for pain.  Discharge home.  Normal EKG.  Normal troponin.  No concerning vital signs.  Final Clinical Impressions(s) / ED Diagnoses   Final diagnoses:  Chest pain, unspecified type    ED Discharge Orders    None       Rolland Porter, MD 12/05/17 2352    Rolland Porter, MD 12/16/17 1610    Rolland Porter, MD 02/01/18 1537

## 2017-12-05 NOTE — Discharge Instructions (Signed)
Tylenol as needed for chest pain. Follow up with your primary care physician. Continue your blood thinner at your current dosage. Your CT scan today does not show any sign of old or new blood clots.

## 2017-12-05 NOTE — ED Triage Notes (Signed)
C/o CP x today-states feels same as when had PE 3 months ago-NAD-steady gait

## 2018-04-09 ENCOUNTER — Emergency Department (HOSPITAL_BASED_OUTPATIENT_CLINIC_OR_DEPARTMENT_OTHER): Payer: Medicaid Other

## 2018-04-09 ENCOUNTER — Encounter (HOSPITAL_BASED_OUTPATIENT_CLINIC_OR_DEPARTMENT_OTHER): Payer: Self-pay | Admitting: *Deleted

## 2018-04-09 ENCOUNTER — Emergency Department (HOSPITAL_BASED_OUTPATIENT_CLINIC_OR_DEPARTMENT_OTHER)
Admission: EM | Admit: 2018-04-09 | Discharge: 2018-04-10 | Disposition: A | Discharge status: Home / Self Care | Payer: Medicaid Other | Attending: Emergency Medicine | Admitting: Emergency Medicine

## 2018-04-09 ENCOUNTER — Other Ambulatory Visit: Payer: Self-pay

## 2018-04-09 DIAGNOSIS — M79604 Pain in right leg: Secondary | ICD-10-CM | POA: Diagnosis not present

## 2018-04-09 DIAGNOSIS — M79605 Pain in left leg: Secondary | ICD-10-CM | POA: Diagnosis not present

## 2018-04-09 DIAGNOSIS — Z7901 Long term (current) use of anticoagulants: Secondary | ICD-10-CM | POA: Diagnosis not present

## 2018-04-09 NOTE — ED Provider Notes (Signed)
[facility] Provider Note: [name], MD, FACEP  CSN: [id number] MRN: [id number] ARRIVAL: 04/09/18 at 2223 ROOM: MH10/MH10   CHIEF COMPLAINT  Leg Pain   HISTORY OF PRESENT ILLNESS  04/09/18 11:39 PM Christie Hernandez is a 32 y.o. 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  She recently had a car trip to [place] about a week ago.  She is here with a 2-day history of pain in her lower legs.  The pain is both anterior and posterior and includes the popliteal fossae.  She rates her pain as a 7 out of 10, worse with movement or palpation.  She has chronic lower extremity edema and this has not significantly changed.  She denies chest pain or shortness of breath.  She states she has been compliant with her Eliquis.  She was sent here by her doctor for evaluation of DVT.   Past Medical History:  Diagnosis Date  . Pain and swelling of lower extremity   . Pulmonary emboli (HCC)     History reviewed. No pertinent surgical history.  History reviewed. No pertinent family history.  Social History   Tobacco Use  . Smoking status: Never Smoker  . Smokeless tobacco: Never Used  Substance Use Topics  . Alcohol use: No  . Drug use: No    Prior to Admission medications   Medication Sig Start Date End Date Taking? Authorizing Provider  Apixaban (ELIQUIS PO) Take by mouth.    [provider]    Allergies Patient has no known allergies.   REVIEW OF SYSTEMS  Negative except as noted here or in the History of Present Illness.   PHYSICAL EXAMINATION  Initial Vital Signs Blood pressure (!) 166/85, pulse 86, temperature 98 F (36.7 C), temperature source Oral, resp. rate 18, height  (1.626 m), weight (!) 165.1 kg (364 lb), last menstrual period 03/26/2018, SpO2 99 %.  Examination General: Well-developed, obese female in no acute distress; appearance consistent with age of record HENT: normocephalic; atraumatic Eyes: pupils equal, round  and reactive to light; extraocular muscles intact Neck: supple Heart: regular rate and rhythm Lungs: clear to auscultation bilaterally Abdomen: soft; nondistended; nontender; bowel sounds present Extremities: No deformity; full range of motion; pulses normal; edema of lower legs and feet Neurologic: Awake, alert and oriented; motor function intact in all extremities and symmetric; no facial droop Skin: Warm and dry Psychiatric: Normal mood and affect   RESULTS  Summary of this visit's results, reviewed by myself:   EKG Interpretation  Date/Time:    Ventricular Rate:    PR Interval:    QRS Duration:   QT Interval:    QTC Calculation:   R Axis:     Text Interpretation:        Laboratory Studies: No results found for this or any previous visit (from the past 24 hour(s)). Imaging Studies: US Venous Img Lower Bilateral  Result Date: 04/10/2018 CLINICAL DATA:  Bilateral lower extremity pain after a long car ride 2 days ago. Protein C deficiency. EXAM: BILATERAL LOWER EXTREMITY VENOUS DOPPLER ULTRASOUND TECHNIQUE: Gray-scale sonography with graded compression, as well as color Doppler and duplex ultrasound were performed to evaluate the lower extremity deep venous systems from the level of the common femoral vein and including the common femoral, femoral, profunda femoral, popliteal and calf veins including the posterior tibial, peroneal and gastrocnemius veins when visible. The superficial great saphenous vein was also interrogated. Spectral Doppler was utilized to evaluate flow at rest and  with distal augmentation maneuvers in the common femoral, femoral and popliteal veins. COMPARISON:  None. FINDINGS: RIGHT LOWER EXTREMITY Common Femoral Vein: No evidence of thrombus. Normal compressibility, respiratory phasicity and response to augmentation. Saphenofemoral Junction: No evidence of thrombus. Normal compressibility and flow on color Doppler imaging. Profunda Femoral Vein: No evidence of  thrombus. Normal compressibility and flow on color Doppler imaging. Femoral Vein: No evidence of thrombus. Normal compressibility, respiratory phasicity and response to augmentation. Popliteal Vein: No evidence of thrombus. Normal compressibility, respiratory phasicity and response to augmentation. Calf Veins: No evidence of thrombus. Normal compressibility and flow on color Doppler imaging. Superficial Great Saphenous Vein: No evidence of thrombus. Normal compressibility. Venous Reflux:  None. Other Findings:  None. LEFT LOWER EXTREMITY Common Femoral Vein: No evidence of thrombus. Normal compressibility, respiratory phasicity and response to augmentation. Saphenofemoral Junction: No evidence of thrombus. Normal compressibility and flow on color Doppler imaging. Profunda Femoral Vein: No evidence of thrombus. Normal compressibility and flow on color Doppler imaging. Femoral Vein: No evidence of thrombus. Normal compressibility, respiratory phasicity and response to augmentation. Popliteal Vein: No evidence of thrombus. Normal compressibility, respiratory phasicity and response to augmentation. Calf Veins: No evidence of thrombus. Normal compressibility and flow on color Doppler imaging. Superficial Great Saphenous Vein: No evidence of thrombus. Normal compressibility. Venous Reflux:  None. Other Findings:  None. IMPRESSION: No evidence of deep venous thrombosis. Electronically Signed   By: Burman Nieves M.D.   On: 04/10/2018 01:00    ED COURSE and MDM  Nursing notes and initial vitals signs, including pulse oximetry, reviewed.  Vitals:   04/09/18 2228 04/09/18 2229  BP:  (!) 166/85  Pulse:  86  Resp:  18  Temp:  98 F (36.7 C)  TempSrc:  Oral  SpO2:  99%  Weight: (!) 165.1 kg (364 lb)   Height:  (1.626 m)    Patient advised of negative Doppler studies.  As noted above she is already on Eliquis for treatment of thromboembolic disease.  PROCEDURES    ED DIAGNOSES     ICD-10-CM   1.  Bilateral leg pain M79.604    M79.605        Paula Libra, MD 04/10/18 [phone]

## 2018-04-09 NOTE — ED Triage Notes (Signed)
Pt c/o bil leg pain x 2 days, pt states swelling is normal

## 2018-04-09 NOTE — ED Notes (Signed)
Patient transported to US 

## 2018-09-01 ENCOUNTER — Other Ambulatory Visit: Payer: Self-pay | Admitting: Obstetrics & Gynecology

## 2018-09-01 DIAGNOSIS — B379 Candidiasis, unspecified: Secondary | ICD-10-CM

## 2022-01-31 ENCOUNTER — Encounter (HOSPITAL_COMMUNITY): Payer: Self-pay | Admitting: Obstetrics & Gynecology

## 2022-01-31 ENCOUNTER — Inpatient Hospital Stay (HOSPITAL_COMMUNITY)
Admission: AD | Admit: 2022-01-31 | Discharge: 2022-01-31 | Disposition: A | Discharge status: Home / Self Care | Payer: Medicaid Other | Attending: Family Medicine | Admitting: Family Medicine

## 2022-01-31 ENCOUNTER — Inpatient Hospital Stay (HOSPITAL_COMMUNITY): Payer: Medicaid Other

## 2022-01-31 DIAGNOSIS — Z86711 Personal history of pulmonary embolism: Secondary | ICD-10-CM | POA: Insufficient documentation

## 2022-01-31 DIAGNOSIS — O200 Threatened abortion: Secondary | ICD-10-CM

## 2022-01-31 DIAGNOSIS — O0931 Supervision of pregnancy with insufficient antenatal care, first trimester: Secondary | ICD-10-CM | POA: Diagnosis not present

## 2022-01-31 DIAGNOSIS — N939 Abnormal uterine and vaginal bleeding, unspecified: Secondary | ICD-10-CM

## 2022-01-31 DIAGNOSIS — Z3A01 Less than 8 weeks gestation of pregnancy: Secondary | ICD-10-CM | POA: Diagnosis not present

## 2022-01-31 DIAGNOSIS — Z7901 Long term (current) use of anticoagulants: Secondary | ICD-10-CM | POA: Insufficient documentation

## 2022-01-31 LAB — CBC
HCT: 32.2 % — ABNORMAL LOW (ref 36.0–46.0)
Hemoglobin: 9.6 g/dL — ABNORMAL LOW (ref 12.0–15.0)
MCH: 22.5 pg — ABNORMAL LOW (ref 26.0–34.0)
MCHC: 29.8 g/dL — ABNORMAL LOW (ref 30.0–36.0)
MCV: 75.6 fL — ABNORMAL LOW (ref 80.0–100.0)
Platelets: 266 10*3/uL (ref 150–400)
RBC: 4.26 MIL/uL (ref 3.87–5.11)
RDW: 15.6 % — ABNORMAL HIGH (ref 11.5–15.5)
WBC: 4.7 10*3/uL (ref 4.0–10.5)
nRBC: 0 % (ref 0.0–0.2)

## 2022-01-31 LAB — HCG, QUANTITATIVE, PREGNANCY: hCG, Beta Chain, Quant, S: 167 m[IU]/mL — ABNORMAL HIGH (ref <5)

## 2022-01-31 LAB — URINALYSIS, ROUTINE W REFLEX MICROSCOPIC
Bacteria, UA: NONE SEEN
Bilirubin Urine: NEGATIVE
Glucose, UA: NEGATIVE mg/dL
Ketones, ur: NEGATIVE mg/dL
Nitrite: NEGATIVE
Protein, ur: NEGATIVE mg/dL
RBC / HPF: >50 RBC/hpf — ABNORMAL HIGH (ref 0–5)
Specific Gravity, Urine: 1.033 — ABNORMAL HIGH (ref 1.005–1.030)
pH: 5 (ref 5.0–8.0)

## 2022-01-31 LAB — ABO/RH: ABO/RH(D): B POS

## 2022-01-31 LAB — WET PREP, GENITAL
Clue Cells Wet Prep HPF POC: NONE SEEN
Sperm: NONE SEEN
Trich, Wet Prep: NONE SEEN
WBC, Wet Prep HPF POC: >=10 — AB (ref <10)
Yeast Wet Prep HPF POC: NONE SEEN

## 2022-01-31 LAB — POCT PREGNANCY, URINE: Preg Test, Ur: POSITIVE — AB

## 2022-01-31 MED ORDER — ENOXAPARIN SODIUM 120 MG/0.8ML IJ SOSY: 120.0000 mg | PREFILLED_SYRINGE | Freq: Two times a day (BID) | INTRAMUSCULAR | 60 refills | Status: DC

## 2022-01-31 NOTE — MAU Provider Note (Signed)
?History  ?  ? ?CSN: [id number] ? ?Arrival date and time: 01/31/22 1244 ? ? None  ?  ?Chief Complaint  ?Patient presents with  ? Vaginal Bleeding  ? Abdominal Pain  ? Possible Pregnancy  ? ?HPI ?Christie Hernandez is a 36 y.o. G2P0101 at [redacted]w[redacted]d by LMP who presents to MAU for vaginal bleeding and lower abdominal cramping. Patient reports symptoms started Saturday evening, however worsened yesterday. She reports bleeding initially started out as spotting, but got heavier yesterday, having to change maxi pad every 3-4 hours. She reports she passed several large clots. Bleeding has since lessened, however cramping remains constant and nonstop. She has taken Tylenol without relief. She reports she "feels different". Feels like she is "no longer pregnant" as nausea has improved. LMP was 12/15/2021.  ? ?Patient has not yet established prenatal care.  ? ?Patient has a history of pulmonary embolism, currently on Eliquis.  ? ?OB History   ? ? Gravida  ?2  ? Para  ?1  ? Term  ?0  ? Preterm  ?1  ? AB  ?   ? Living  ?1  ?  ? ? SAB  ?   ? IAB  ?   ? Ectopic  ?   ? Multiple  ?   ? Live Births  ?1  ?   ?  ?  ? ? ?Past Medical History:  ?Diagnosis Date  ? Pain and swelling of lower extremity   ? Pulmonary emboli (HCC)   ? ? ?Past Surgical History:  ?Procedure Laterality Date  ? CESAREAN SECTION    ? ? ?History reviewed. No pertinent family history. ? ?Social History  ? ?Tobacco Use  ? Smoking status: Never  ? Smokeless tobacco: Never  ?Substance Use Topics  ? Alcohol use: No  ? Drug use: No  ? ? ?Allergies: No Known Allergies ? ?Medications Prior to Admission  ?Medication Sig Dispense Refill Last Dose  ? Prenatal Vit-Fe Fumarate-FA (PRENATAL MULTIVITAMIN) TABS tablet Take 1 tablet by mouth daily at 12 noon.   01/30/2022  ? Apixaban (ELIQUIS PO) Take by mouth.     ? ? ?Review of Systems  ?Constitutional: Negative.   ?Respiratory: Negative.    ?Cardiovascular: Negative.   ?Gastrointestinal:  Positive for abdominal pain (cramping).   ?Genitourinary:  Positive for vaginal bleeding.  ?Musculoskeletal: Negative.   ?Neurological: Negative.   ?Physical Exam  ? ?Blood pressure 139/74, pulse (!) 59, temperature 98.3 ?F (36.8 ?C), temperature source Oral, resp. rate 18, height 5\' 4"  (1.626 m), weight 115.2 kg, last menstrual period 12/15/2021, SpO2 100 %. ? ?Physical Exam ?Constitutional:   ?   General: She is not in acute distress. ?Cardiovascular:  ?   Rate and Rhythm: Normal rate.  ?Pulmonary:  ?   Effort: Pulmonary effort is normal.  ?Abdominal:  ?   Palpations: Abdomen is soft.  ?   Tenderness: There is no abdominal tenderness.  ?Genitourinary: ?   Comments: NEFG, vaginal walls pink with rugae, small amount of bright red bleeding noted to be coming from os, cervix visually closed without lesions/masses ?Skin: ?   General: Skin is warm and dry.  ?Neurological:  ?   General: No focal deficit present.  ?   Mental Status: She is alert and oriented to person, place, and time.  ?Psychiatric:     ?   Mood and Affect: Mood normal.     ?   Behavior: Behavior normal.  ? ?02/12/2022 OB LESS THAN 14 WEEKS WITH OB  TRANSVAGINAL ? ?Result Date: 01/31/2022 ?CLINICAL DATA:  No facial vaginal bleeding. Beta HCG 167. Gestational age by LMP is 6 weeks 5 days. EXAM: OBSTETRIC <14 WK [place] AND TRANSVAGINAL OB US TECHNIQUE: Both transabdominal and transvaginal ultrasound examinations were performed for complete evaluation of the gestation as well as the maternal uterus, adnexal regions, and pelvic cul-de-sac. Transvaginal technique was performed to assess early pregnancy. COMPARISON:  None. FINDINGS: Intrauterine gestational sac: Probable small in the lower uterine segment. Yolk sac:  Not Visualized. Embryo:  Not Visualized. MSD: 3.6 mm   5 w   0  d Subchorionic hemorrhage:  None visualized. Maternal uterus/adnexae: Anteverted uterus, endometrium measures 12 mm. There is a probable small intrauterine gestational sac in the lower uterine segment. The right ovary is visualized and is  normal. The left ovary is not seen. There is no adnexal mass. Trace free fluid in the dependent pelvis. IMPRESSION: 1. Probable early intrauterine gestational sac in the lower uterine segment, but no yolk sac, fetal pole, or cardiac activity yet visualized. Recommend follow-up quantitative B-HCG levels and follow-up US in 14 days to assess viability. This recommendation follows SRU consensus guidelines: Diagnostic Criteria for Nonviable Pregnancy Early in the First Trimester. Malva Limes Med 2013; 703:5009-38. 2. No sonographic findings of a caput pregnancy. Electronically Signed   By: Narda Rutherford M.D.   On: 01/31/2022 15:19   ? ?MAU Course  ?Procedures ? ?MDM ?Blood type B positive, Rhogam not indicated ?Hcg 167 ?Wet prep negative, GC/CT pending ?Ultrasound with results as above ?Reviewed patient's use of Eliquis with Dr. Adrian Blackwater. Will d/c Eliquis and start on Lovenox 120mg  BID ? ?Assessment and Plan  ?Threatened miscarriage ?Vaginal bleeding ? ?- Discharge home in stable condition ?- Pelvic rest ?- Strict return precautions reviewed. Return to MAU sooner or as needed for worsening symptoms ?- Rx for Lovenox sent to pharmacy ?- F/u bHCG in 48 hours ? ? ? , CNM ?01/31/2022, 3:53 PM  ?

## 2022-01-31 NOTE — MAU Note (Signed)
Christie Hernandez is a 36 y.o. here in MAU reporting: is 6 wks preg.  Started cramping and bleeding real bad yesterday. Has not had preg confirmed anywhere yet.  Not as heavy as a period, changing every 3 hrs. ?LMP: 2/1 ?Onset of complaint: 3/19 ?Pain score: 7/10 ?Vitals:  ? 01/31/22 1256  ?BP: 139/74  ?Pulse: (!) 59  ?Resp: 18  ?Temp: 98.3 ?F (36.8 ?C)  ?SpO2: 100%  ?   ? ?Lab orders placed from triage:  urine preg, UA if preg is + ?

## 2022-02-01 LAB — GC/CHLAMYDIA PROBE AMP (~~LOC~~) NOT AT ARMC
Chlamydia: NEGATIVE
Comment: NEGATIVE
Comment: NORMAL
Neisseria Gonorrhea: NEGATIVE

## 2022-02-02 ENCOUNTER — Other Ambulatory Visit: Payer: Self-pay

## 2022-02-02 ENCOUNTER — Other Ambulatory Visit (INDEPENDENT_AMBULATORY_CARE_PROVIDER_SITE_OTHER): Payer: Self-pay

## 2022-02-02 DIAGNOSIS — O4690 Antepartum hemorrhage, unspecified, unspecified trimester: Secondary | ICD-10-CM

## 2022-02-03 ENCOUNTER — Telehealth: Payer: Self-pay

## 2022-02-03 LAB — BETA HCG QUANT (REF LAB): hCG Quant: 51 m[IU]/mL

## 2022-02-03 NOTE — Progress Notes (Signed)
Pt presents for Beta hCG. Pt was sent to the lab to have labs drawn. ?Salome Hautala l Abdulloh Ullom, CMA  ?

## 2022-02-03 NOTE — Telephone Encounter (Signed)
Called pt to inform her that her hCG results have dropped. Left a message for pt to call the office back. ?Tray Klayman l Deah Ottaway, CMA  ?

## 2022-02-03 NOTE — Telephone Encounter (Signed)
-----   Message from Levie Heritage, DO sent at 02/03/2022  8:18 AM EDT ----- ?HCG dropped, so she is miscarrying. If she doesn't have a menses in 6 weeks, then she should contact our office for follow up. ?

## 2022-02-14 ENCOUNTER — Ambulatory Visit (INDEPENDENT_AMBULATORY_CARE_PROVIDER_SITE_OTHER): Payer: Medicaid Other | Admitting: Obstetrics & Gynecology

## 2022-02-14 ENCOUNTER — Ambulatory Visit
Admission: RE | Admit: 2022-02-14 | Discharge: 2022-02-14 | Disposition: A | Discharge status: Home / Self Care | Payer: Medicaid Other | Source: Ambulatory Visit

## 2022-02-14 ENCOUNTER — Encounter: Payer: Self-pay | Admitting: Obstetrics & Gynecology

## 2022-02-14 ENCOUNTER — Other Ambulatory Visit (HOSPITAL_COMMUNITY): Payer: Self-pay

## 2022-02-14 VITALS — BP 144/86 | HR 73 | Ht 64.0 in | Wt 254.6 lb

## 2022-02-14 DIAGNOSIS — O039 Complete or unspecified spontaneous abortion without complication: Secondary | ICD-10-CM

## 2022-02-14 DIAGNOSIS — O200 Threatened abortion: Secondary | ICD-10-CM | POA: Insufficient documentation

## 2022-02-14 DIAGNOSIS — N939 Abnormal uterine and vaginal bleeding, unspecified: Secondary | ICD-10-CM | POA: Insufficient documentation

## 2022-02-14 NOTE — Progress Notes (Signed)
Patient ID: Christie Hernandez, female   DOB: [birth year], 36 y.o.   MRN: [id number] ?Ultrasounds Results Note ? ?SUBJECTIVE ?HPI:  ?Christie Hernandez is a 36 y.o. G2P0101 at [redacted]w[redacted]d by LMP who presents to the [facility] for followup ultrasound results. The patient denies abdominal pain or vaginal bleeding.  ?Upon review of the patient's records, patient was first seen in [facility]U on 3/20 for bleeding.   BHCG on that day was 167.  Ultrasound showed probable GS in LUS.  Last seen in MAU on 3/22. BHCG was 51.  Repeat ultrasound was performed earlier today.  ? ?Past Medical History:  ?Diagnosis Date  ? Pain and swelling of lower extremity   ? Pulmonary emboli (HCC)   ? ?Past Surgical History:  ?Procedure Laterality Date  ? CESAREAN SECTION    ? ?Social History  ? ?Socioeconomic History  ? Marital status: Single  ?  Spouse name: Not on file  ? Number of children: Not on file  ? Years of education: Not on file  ? Highest education level: Not on file  ?Occupational History  ? Not on file  ?Tobacco Use  ? Smoking status: Never  ? Smokeless tobacco: Never  ?Substance and Sexual Activity  ? Alcohol use: No  ? Drug use: No  ? Sexual activity: Yes  ?  Birth control/protection: None  ?Other Topics Concern  ? Not on file  ?Social History Narrative  ? Not on file  ? ?Social Determinants of Health  ? ?Financial Resource Strain: Not on file  ?Food Insecurity: Not on file  ?Transportation Needs: Not on file  ?Physical Activity: Not on file  ?Stress: Not on file  ?Social Connections: Not on file  ?Intimate Partner Violence: Not on file  ? ?Current Outpatient Medications on File Prior to Visit  ?Medication Sig Dispense Refill  ? enoxaparin (LOVENOX) 120 MG/0.8ML injection Inject 0.8 mLs (120 mg total) into the skin every 12 (twelve) hours. 0.8 mL 60  ? Prenatal Vit-Fe Fumarate-FA (PRENATAL MULTIVITAMIN) TABS tablet Take 1 tablet by mouth daily at 12 noon.    ? ?No current facility-administered medications on file prior to visit.   ? ?No Known Allergies ? ?I have reviewed patient's Past Medical Hx, Surgical Hx, Family Hx, Social Hx, medications and allergies.  ? ?Review of Systems ?Review of Systems  ?Constitutional: Negative for fever and chills.  ?Gastrointestinal: Negative for nausea, vomiting, abdominal pain, diarrhea and constipation.  ?Genitourinary: Negative for dysuria.  ?Musculoskeletal: Negative for back pain.  ?Neurological: Negative for dizziness and weakness.  ? ? ?Physical Exam  ?BP (!) 144/86   Pulse 73   Ht 5\' 4"  (1.626 m)   Wt 254 lb 9.6 oz (115.5 kg)   LMP 12/15/2021   BMI 43.70 kg/m?   ?GENERAL: Well-developed, well-nourished female in no acute distress.  ?HEENT: Normocephalic, atraumatic.   ?LUNGS: Effort normal ?ABDOMEN: soft, non-tender ?HEART: Regular rate  ?SKIN: Warm, dry and without erythema ?PSYCH: Normal mood and affect ?NEURO: Alert and oriented x 4 ? ?LAB RESULTS ?No results found for this or any previous visit (from the past 24 hour(s)). ? ?IMAGING ?02/12/2022 OB Transvaginal ? ?Result Date: 02/14/2022 ?CLINICAL DATA:  Threatened miscarriage EXAM: TRANSVAGINAL OB ULTRASOUND TECHNIQUE: Transvaginal ultrasound was performed for complete evaluation of the gestation as well as the maternal uterus, adnexal regions, and pelvic cul-de-sac. COMPARISON:  Obstetric ultrasound 01/31/2022 FINDINGS: Intrauterine gestational sac: None Yolk sac:  Not Visualized. Embryo:  Not Visualized. Cardiac Activity: Not Visualized. Subchorionic hemorrhage:  None visualized. Maternal uterus/adnexae: The right ovary is normal measuring 2.3 cm by 3.0 cm x 2.2 cm. The left ovary is normal measuring 0.8 cm x 1.9 cm x 1.6 cm. There is small volume free fluid in the pelvis. IMPRESSION: 1. No intrauterine gestational sac identified. Findings most suspicious for completed miscarriage. Recommend OB follow up and correlation with bHCG. 2. Small volume free fluid in the pelvis. Electronically Signed   By: Lesia Hausen M.D.   On: 02/14/2022 14:43  ? ?US OB  LESS THAN 14 WEEKS WITH OB TRANSVAGINAL ? ?Result Date: 01/31/2022 ?CLINICAL DATA:  No facial vaginal bleeding. Beta HCG 167. Gestational age by LMP is 6 weeks 5 days. EXAM: OBSTETRIC <14 WK [place] AND TRANSVAGINAL OB US TECHNIQUE: Both transabdominal and transvaginal ultrasound examinations were performed for complete evaluation of the gestation as well as the maternal uterus, adnexal regions, and pelvic cul-de-sac. Transvaginal technique was performed to assess early pregnancy. COMPARISON:  None. FINDINGS: Intrauterine gestational sac: Probable small in the lower uterine segment. Yolk sac:  Not Visualized. Embryo:  Not Visualized. MSD: 3.6 mm   5 w   0  d Subchorionic hemorrhage:  None visualized. Maternal uterus/adnexae: Anteverted uterus, endometrium measures 12 mm. There is a probable small intrauterine gestational sac in the lower uterine segment. The right ovary is visualized and is normal. The left ovary is not seen. There is no adnexal mass. Trace free fluid in the dependent pelvis. IMPRESSION: 1. Probable early intrauterine gestational sac in the lower uterine segment, but no yolk sac, fetal pole, or cardiac activity yet visualized. Recommend follow-up quantitative B-HCG levels and follow-up US in 14 days to assess viability. This recommendation follows SRU consensus guidelines: Diagnostic Criteria for Nonviable Pregnancy Early in the First Trimester. Malva Limes Med 2013; 938:1829-93. 2. No sonographic findings of a caput pregnancy. Electronically Signed   By: Narda Rutherford M.D.   On: 01/31/2022 15:19   ? ?ASSESSMENT ?Complete miscarriage ? ? ?PLAN ?Discharge home in stable condition ?Patient advised to continue taking prenatal vitamins ?Confirms SAb ? ? ?Adam Phenix, MD  ?02/14/2022  ?3:22 PM ? ? ? ? ? ? ?

## 2022-02-14 NOTE — Progress Notes (Deleted)
Patient ID: Christie Hernandez, female   DOB: 01-04-1986, 36 y.o.   MRN: [id number] ?.cwhus ?

## 2022-04-18 ENCOUNTER — Ambulatory Visit: Payer: Self-pay | Admitting: Podiatry

## 2022-04-22 ENCOUNTER — Encounter: Payer: Self-pay | Admitting: Radiology

## 2022-04-25 ENCOUNTER — Ambulatory Visit: Payer: Medicaid Other | Admitting: Podiatry

## 2022-05-31 ENCOUNTER — Emergency Department (HOSPITAL_BASED_OUTPATIENT_CLINIC_OR_DEPARTMENT_OTHER)
Admission: EM | Admit: 2022-05-31 | Discharge: 2022-05-31 | Disposition: A | Discharge status: Home / Self Care | Payer: Medicaid Other | Attending: Emergency Medicine | Admitting: Emergency Medicine

## 2022-05-31 ENCOUNTER — Emergency Department (HOSPITAL_BASED_OUTPATIENT_CLINIC_OR_DEPARTMENT_OTHER): Payer: Medicaid Other

## 2022-05-31 ENCOUNTER — Encounter (HOSPITAL_BASED_OUTPATIENT_CLINIC_OR_DEPARTMENT_OTHER): Payer: Self-pay | Admitting: Pediatrics

## 2022-05-31 ENCOUNTER — Other Ambulatory Visit: Payer: Self-pay

## 2022-05-31 DIAGNOSIS — M79605 Pain in left leg: Secondary | ICD-10-CM | POA: Diagnosis not present

## 2022-05-31 DIAGNOSIS — R600 Localized edema: Secondary | ICD-10-CM | POA: Insufficient documentation

## 2022-05-31 DIAGNOSIS — M7989 Other specified soft tissue disorders: Secondary | ICD-10-CM | POA: Diagnosis present

## 2022-05-31 DIAGNOSIS — M79604 Pain in right leg: Secondary | ICD-10-CM | POA: Insufficient documentation

## 2022-05-31 LAB — CBC WITH DIFFERENTIAL/PLATELET
Abs Immature Granulocytes: 0.02 10*3/uL (ref 0.00–0.07)
Basophils Absolute: 0 10*3/uL (ref 0.0–0.1)
Basophils Relative: 1 %
Eosinophils Absolute: 0 10*3/uL (ref 0.0–0.5)
Eosinophils Relative: 1 %
HCT: 35.1 % — ABNORMAL LOW (ref 36.0–46.0)
Hemoglobin: 11.3 g/dL — ABNORMAL LOW (ref 12.0–15.0)
Immature Granulocytes: 0 %
Lymphocytes Relative: 20 %
Lymphs Abs: 1 10*3/uL (ref 0.7–4.0)
MCH: 25.6 pg — ABNORMAL LOW (ref 26.0–34.0)
MCHC: 32.2 g/dL (ref 30.0–36.0)
MCV: 79.4 fL — ABNORMAL LOW (ref 80.0–100.0)
Monocytes Absolute: 0.8 10*3/uL (ref 0.1–1.0)
Monocytes Relative: 15 %
Neutro Abs: 3.2 10*3/uL (ref 1.7–7.7)
Neutrophils Relative %: 63 %
Platelets: 236 10*3/uL (ref 150–400)
RBC: 4.42 MIL/uL (ref 3.87–5.11)
RDW: 19.4 % — ABNORMAL HIGH (ref 11.5–15.5)
WBC: 5.1 10*3/uL (ref 4.0–10.5)
nRBC: 0 % (ref 0.0–0.2)

## 2022-05-31 LAB — BASIC METABOLIC PANEL
Anion gap: 7 (ref 5–15)
BUN: 22 mg/dL — ABNORMAL HIGH (ref 6–20)
CO2: 24 mmol/L (ref 22–32)
Calcium: 8.8 mg/dL — ABNORMAL LOW (ref 8.9–10.3)
Chloride: 103 mmol/L (ref 98–111)
Creatinine, Ser: 0.81 mg/dL (ref 0.44–1.00)
GFR, Estimated: >60 mL/min (ref >60)
Glucose, Bld: 83 mg/dL (ref 70–99)
Potassium: 3.5 mmol/L (ref 3.5–5.1)
Sodium: 134 mmol/L — ABNORMAL LOW (ref 135–145)

## 2022-05-31 LAB — PREGNANCY, URINE: Preg Test, Ur: NEGATIVE

## 2022-05-31 MED ORDER — IOHEXOL 350 MG/ML SOLN
100.0000 mL | Freq: Once | INTRAVENOUS | Status: AC | PRN
  Administered 2022-05-31: 100 mL via INTRAVENOUS

## 2022-05-31 NOTE — ED Triage Notes (Signed)
Reported was sent by PCP concern for blood clots due to bilateral leg swelling + pain; reports hx of blood clots.

## 2022-05-31 NOTE — Discharge Instructions (Addendum)
You were seen emergency department today for leg swelling and fatigue.  As we discussed we did not see any evidence of blood clot in your legs or your lungs.  Continue your Eliquis as prescribed.  I recommend following up with your primary doctor about your leg swelling, as it could be a chronic change.  Continue to monitor how you're doing and return to the ER for new or worsening symptoms.

## 2022-05-31 NOTE — ED Provider Notes (Signed)
[facility] EMERGENCY DEPARTMENT Provider Note   CSN: [id number] Arrival date & time: 05/31/22  1434     History  Chief Complaint  Patient presents with   Leg Swelling    Christie Hernandez is a 36 y.o. female who presents the emergency department complaining of bilateral leg pain and swelling.  States that she spoke with her primary doctor and they sent her for evaluation due to concern for blood clots.  Patient had a pulmonary embolism in 2015, was put on a short course of Coumadin.  She had recurrence with additional small pulmonary emboli in 2018, and was placed on Xarelto indefinitely, was later transitioned to Eliquis.  She states she has been taking it as prescribed, with no missed doses.  Is not sure if it was confirmed her blood clots came from her legs or not.   For the past 3 days she has had pain and swelling in both of her legs, slightly worse in her left compared to her right.  She states when she had the blood clots in her lungs previously that she had some shortness of breath, but her biggest complaint was overall fatigue.  She is complaining of fatigue today as well as "her whole body feeling different".  When asked if she has been feeling particularly short of breath patient states "I do not tend to notice those things" but she has been getting more tired with exerting herself.   HPI     Home Medications Prior to Admission medications   Medication Sig Start Date End Date Taking? Authorizing Provider  enoxaparin (LOVENOX) 120 MG/0.8ML injection Inject 0.8 mLs (120 mg total) into the skin every 12 (twelve) hours. 01/31/22   Brand Males, CNM  Prenatal Vit-Fe Fumarate-FA (PRENATAL MULTIVITAMIN) TABS tablet Take 1 tablet by mouth daily at 12 noon.    [provider]      Allergies    Patient has no known allergies.    Review of Systems   Review of Systems  Constitutional:  Positive for fatigue.  Respiratory:  Positive for shortness of breath.    Cardiovascular:  Positive for leg swelling. Negative for chest pain.  All other systems reviewed and are negative.   Physical Exam Updated Vital Signs BP 135/80   Pulse (!) 59   Temp 98.5 F (36.9 C) (Oral)   Resp 19   Ht 5\' 4"  (1.626 m)   Wt 106.6 kg   LMP 05/11/2022 (Approximate)   SpO2 100%   Breastfeeding Unknown   BMI 40.34 kg/m  Physical Exam Vitals and nursing note reviewed.  Constitutional:      Appearance: Normal appearance.  HENT:     Head: Normocephalic and atraumatic.  Eyes:     Conjunctiva/sclera: Conjunctivae normal.  Cardiovascular:     Rate and Rhythm: Normal rate and regular rhythm.     Pulses:          Posterior tibial pulses are 2+ on the right side and 2+ on the left side.     Comments: Chronic lower leg edema with 1+ pitting bilaterally Pulmonary:     Effort: Pulmonary effort is normal. No respiratory distress.     Breath sounds: Normal breath sounds.  Abdominal:     General: There is no distension.     Palpations: Abdomen is soft.     Tenderness: There is no abdominal tenderness.  Skin:    General: Skin is warm and dry.  Neurological:     General: No focal  deficit present.     Mental Status: She is alert.     ED Results / Procedures / Treatments   Labs (all labs ordered are listed, but only abnormal results are displayed) Labs Reviewed  BASIC METABOLIC PANEL - Abnormal; Notable for the following components:      Result Value   Sodium 134 (*)    BUN 22 (*)    Calcium 8.8 (*)    All other components within normal limits  CBC WITH DIFFERENTIAL/PLATELET - Abnormal; Notable for the following components:   Hemoglobin 11.3 (*)    HCT 35.1 (*)    MCV 79.4 (*)    MCH 25.6 (*)    RDW 19.4 (*)    All other components within normal limits  PREGNANCY, URINE    EKG None  Radiology US Venous Img Lower Bilateral  Result Date: 05/31/2022 CLINICAL DATA:  Bilateral leg pain and swelling. History of blood clot. EXAM: BILATERAL LOWER  EXTREMITY VENOUS DOPPLER ULTRASOUND TECHNIQUE: Gray-scale sonography with compression, as well as color and duplex ultrasound, were performed to evaluate the deep venous system(s) from the level of the common femoral vein through the popliteal and proximal calf veins. COMPARISON:  Lower extremity duplex 04/10/2018 FINDINGS: VENOUS Normal compressibility of the common femoral, superficial femoral, and popliteal veins. Visualized portions of profunda femoral vein and great saphenous vein unremarkable. No filling defects to suggest DVT on grayscale or color Doppler imaging. Doppler waveforms show normal direction of venous flow, normal respiratory plasticity and response to augmentation. The calf veins are not seen due to calf edema. OTHER Edema noted in both calves. Limitations: None. IMPRESSION: Negative. Electronically Signed   By: Narda Rutherford M.D.   On: 05/31/2022 18:53   CT ANGIO CHEST PE W OR WO CONTRAST  Result Date: 05/31/2022 CLINICAL DATA:  Pulmonary embolism suspected, high probability. Bilateral leg swelling. EXAM: CT ANGIOGRAPHY CHEST WITH CONTRAST TECHNIQUE: Multidetector CT imaging of the chest was performed using the standard protocol during bolus administration of intravenous contrast. Multiplanar CT image reconstructions and MIPs were obtained to evaluate the vascular anatomy. RADIATION DOSE REDUCTION: This exam was performed according to the departmental dose-optimization program which includes automated exposure control, adjustment of the mA and/or kV according to patient size and/or use of iterative reconstruction technique. CONTRAST:  OMNIPAQUE IOHEXOL 350 MG/ML SOLN COMPARISON:  12/05/2017 FINDINGS: Cardiovascular: Heart size is normal. No pericardial fluid. No coronary artery calcification or aortic atherosclerotic calcification. Pulmonary arterial opacification is good. There are no pulmonary emboli. Mediastinum/Nodes: No mass or adenopathy. Lungs/Pleura: No pleural effusion.  Mild chronic scarring at the left lateral lung base. No active process. Upper Abdomen: Mild fatty change of the liver. Musculoskeletal: Normal Review of the MIP images confirms the above findings. IMPRESSION: No pulmonary emboli or other acute chest finding. Mild chronic scarring of the left lateral lung base. Electronically Signed   By: Paulina Fusi M.D.   On: 05/31/2022 17:18    Procedures Procedures    Medications Ordered in ED Medications  iohexol (OMNIPAQUE) 350 MG/ML injection 100 mL (100 mLs Intravenous Contrast Given 05/31/22 1657)    ED Course/ Medical Decision Making/ A&P                           Medical Decision Making Amount and/or Complexity of Data Reviewed Labs: ordered. Radiology: ordered.  Risk Prescription drug management.  This patient is a 37 y.o. female who presents to the ED for concern of  bilateral leg swelling, this involves an extensive number of treatment options, and is a complaint that carries with it a high risk of complications and morbidity. The emergent differential diagnosis prior to evaluation includes, but is not limited to,  peripheral vascular disease, fluid retention, DVT, arterial occlusion .  This is not an exhaustive differential.   Past Medical History / Co-morbidities / Social History: Chronic leg swelling, PE (2015, 2018)  Additional history: Chart reviewed. Pertinent results include: Pulmonary embolism in 2015 associated with pneumonia, placed on several months of Coumadin.  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  Started on Xarelto, later transitioned to Eliquis.  Physical Exam: Physical exam performed. The pertinent findings include: Bilateral 1+ pitting edema that erythema or wounds.  Neurovascularly intact in bilateral lower extremities.  Lab Tests: I ordered, and personally interpreted labs.  The pertinent results include: No leukocytosis, hemoglobin stable.  Electrolytes grossly within normal limits,  with stable kidney function.  Negative pregnancy test.   Imaging Studies: I ordered imaging studies including CT angio chest and bilateral venous ultrasounds which showed no evidence of blood clots.    Cardiac Monitoring:  The patient was maintained on a cardiac monitor.  My attending physician Dr. Wallace Cullens viewed and interpreted the cardiac monitored which showed an underlying rhythm of: sinus rhythm. I agree with this interpretation.   Disposition: After consideration of the diagnostic results and the patients response to treatment, I feel that emergency department workup does not suggest an emergent condition requiring admission or immediate intervention beyond what has been performed at this time. The plan is: discharge to home and recommend following up with PCP. The patient is safe for discharge and has been instructed to return immediately for worsening symptoms, change in symptoms or any other concerns.  Final Clinical Impression(s) / ED Diagnoses Final diagnoses:  Bilateral lower extremity edema    Rx / DC Orders ED Discharge Orders     None      Portions of this report may have been transcribed using voice recognition software. Every effort was made to ensure accuracy; however, inadvertent computerized transcription errors may be present.    Abram Sax T, PA-C 05/31/22 1907    Franne Forts, DO 06/02/22 [phone]

## 2022-08-24 ENCOUNTER — Encounter: Payer: Medicaid Other | Admitting: Radiology

## 2022-08-24 ENCOUNTER — Encounter: Payer: Medicaid Other | Admitting: Nurse Practitioner

## 2022-09-19 ENCOUNTER — Ambulatory Visit: Payer: Medicaid Other | Admitting: Obstetrics and Gynecology

## 2022-09-19 NOTE — Progress Notes (Deleted)
   NEW GYNECOLOGY PATIENT Patient name: Christie Hernandez MRN [id number]  Date of birth: 10/17/1986 Chief Complaint:   No chief complaint on file.     History:  Christie Hernandez is a 36 y.o. G2P0101 being seen today for ***.    HPI   ROS      Gynecologic History Patient's last menstrual period was 12/15/2021. Contraception: {method:5051} Last Pap: ***. Result was {norm/abn:16337} with negative HPV Last Mammogram: ***.  Result was {norm/abn:16337} Last Colonoscopy: ***.  Result was {norm/abn:16337}  Obstetric History OB History  Gravida Para Term Preterm AB Living  2 1 0 1   1  SAB IAB Ectopic Multiple Live Births          1    # Outcome Date GA Lbr Len/2nd Weight Sex Delivery Anes PTL Lv  2 Gravida           1 Preterm 10/17/09     CS-Unspec       Past Medical History:  Diagnosis Date   Pain and swelling of lower extremity    Pulmonary emboli (HCC)     Past Surgical History:  Procedure Laterality Date   CESAREAN SECTION      Current Outpatient Medications on File Prior to Visit  Medication Sig Dispense Refill   enoxaparin (LOVENOX) 120 MG/0.8ML injection Inject 0.8 mLs (120 mg total) into the skin every 12 (twelve) hours. 0.8 mL 60   Prenatal Vit-Fe Fumarate-FA (PRENATAL MULTIVITAMIN) TABS tablet Take 1 tablet by mouth daily at 12 noon.     No current facility-administered medications on file prior to visit.    No Known Allergies  Social History:  reports that she has never smoked. She has never used smokeless tobacco. She reports that she does not drink alcohol and does not use drugs.  No family history on file.  The following portions of the patient's history were reviewed and updated as appropriate: allergies, current medications, past family history, past medical history, past social history, past surgical history and problem list.  Review of Systems Pertinent items noted in HPI and remainder of comprehensive ROS otherwise negative.  Physical Exam:  LMP  12/15/2021  Physical Exam     Assessment and Plan:   1. Dyspareunia, female ***    Routine preventative health maintenance measures emphasized. Please refer to After Visit Summary for other counseling recommendations.      Darliss Cheney, MD Obstetrician & Gynecologist, Faculty [facility] [employer], [place]

## 2022-12-22 ENCOUNTER — Ambulatory Visit (HOSPITAL_BASED_OUTPATIENT_CLINIC_OR_DEPARTMENT_OTHER)
Admission: RE | Admit: 2022-12-22 | Discharge: 2022-12-22 | Disposition: A | Discharge status: Home / Self Care | Payer: Medicaid Other | Source: Ambulatory Visit | Attending: Nurse Practitioner | Admitting: Nurse Practitioner

## 2022-12-22 ENCOUNTER — Other Ambulatory Visit (HOSPITAL_BASED_OUTPATIENT_CLINIC_OR_DEPARTMENT_OTHER): Payer: Self-pay | Admitting: Nurse Practitioner

## 2022-12-22 DIAGNOSIS — W19XXXS Unspecified fall, sequela: Secondary | ICD-10-CM

## 2022-12-22 DIAGNOSIS — M25561 Pain in right knee: Secondary | ICD-10-CM | POA: Insufficient documentation

## 2023-10-04 IMAGING — US US OB < 14 WEEKS - US OB TV
1 series · 15 of 28 positions shown · non-contrast
Comparison: None.

CLINICAL DATA: No facial vaginal bleeding. Beta HCG 167.
Gestational age by LMP is 6 weeks 5 days.

EXAM:
OBSTETRIC <14 WK US AND TRANSVAGINAL OB US
TECHNIQUE: Both transabdominal and transvaginal ultrasound examinations were
performed for complete evaluation of the gestation as well as the
maternal uterus, adnexal regions, and pelvic cul-de-sac.
Transvaginal technique was performed to assess early pregnancy.

[Series 1: us ob < 14 weeks - us ob tv · 78 acquisitions, 15 frames shown]
[im 1/78]
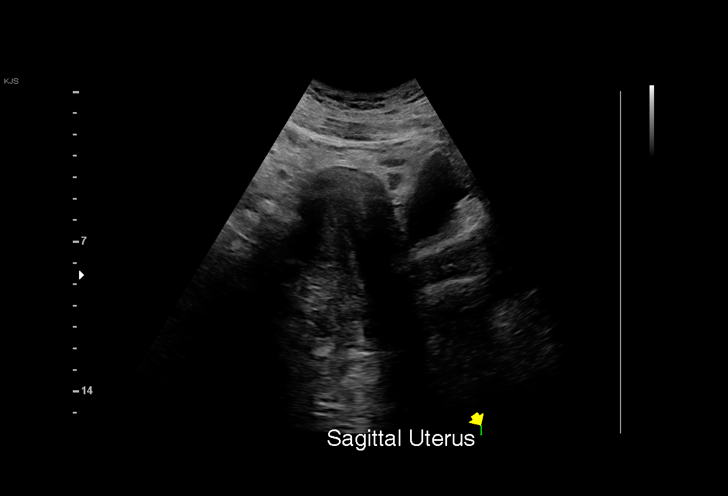
[im 6/78]
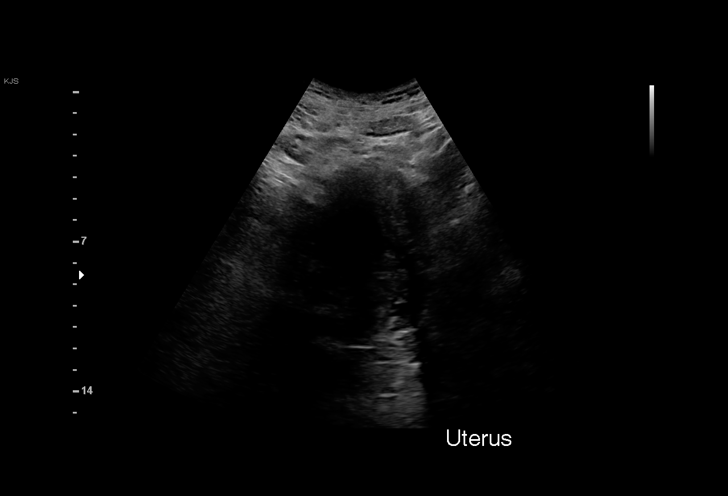
[im 12/78]
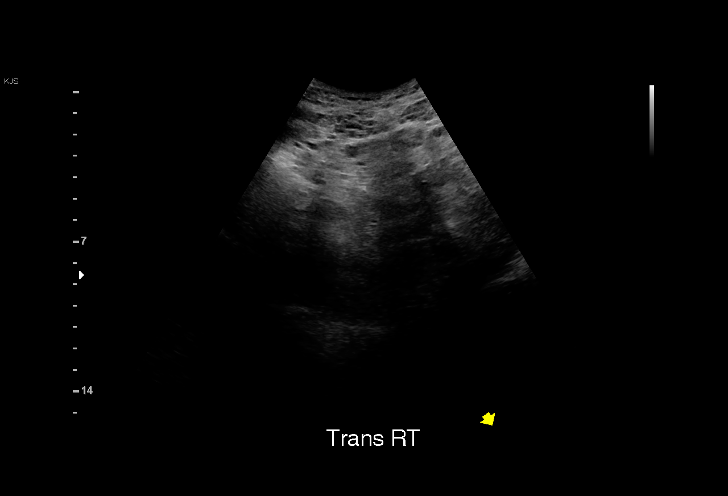
[im 18/78]
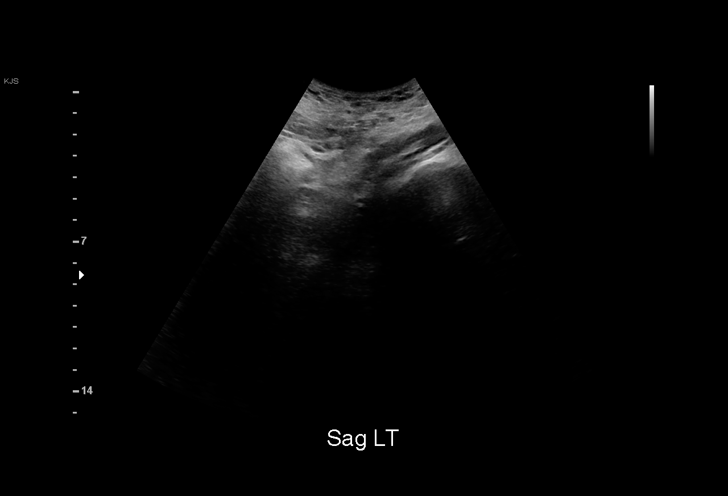
[im 23/78]
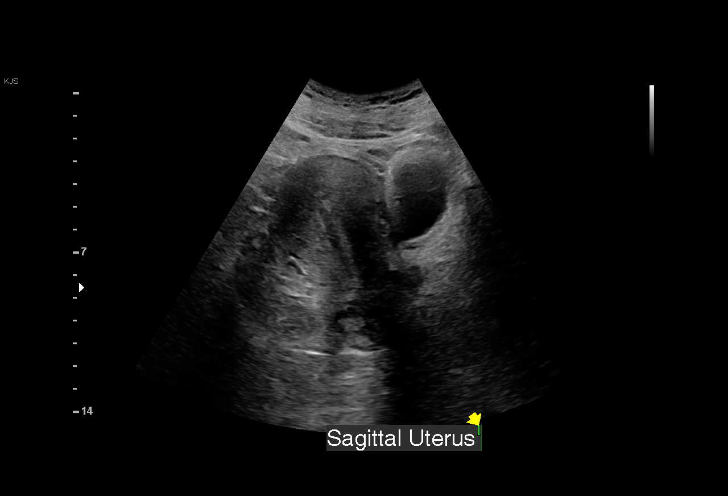
[im 29/78]
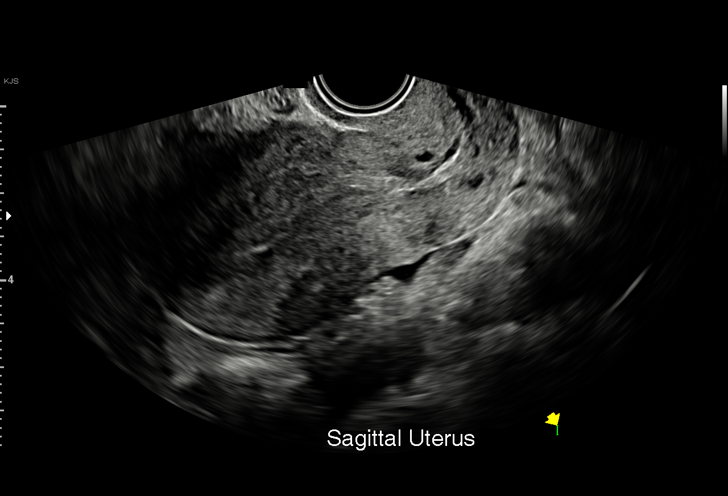
[im 35/78]
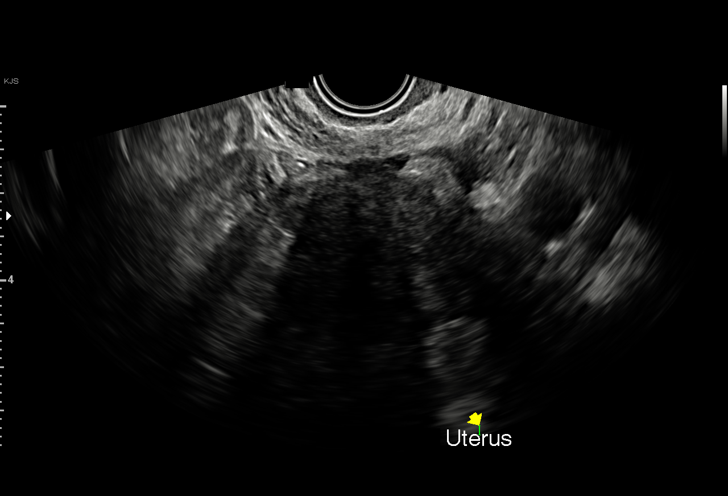
[im 40/78]
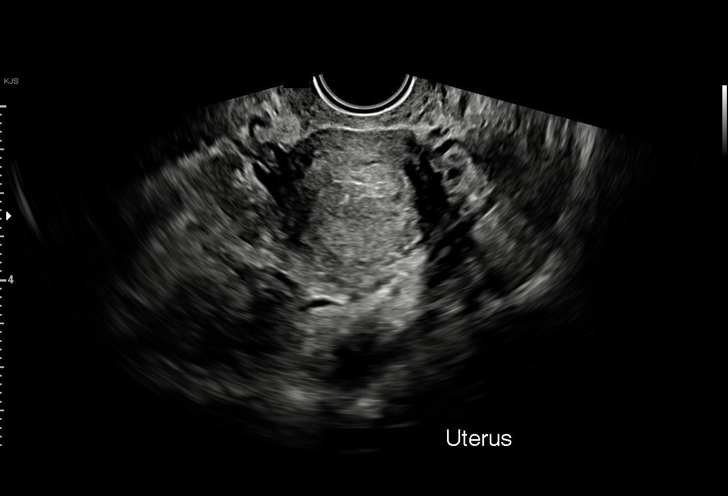
[im 43/78]
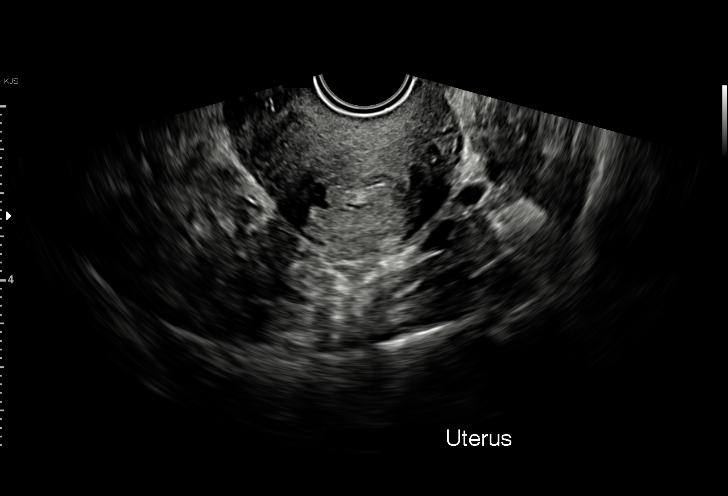
[im 49/78]
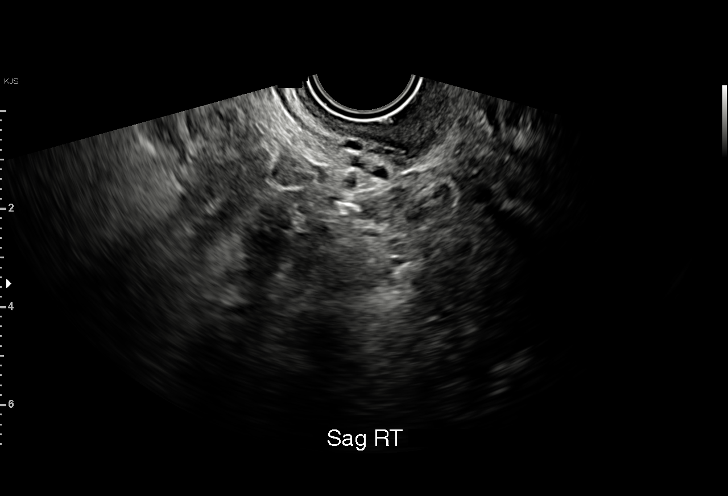
[im 55/78]
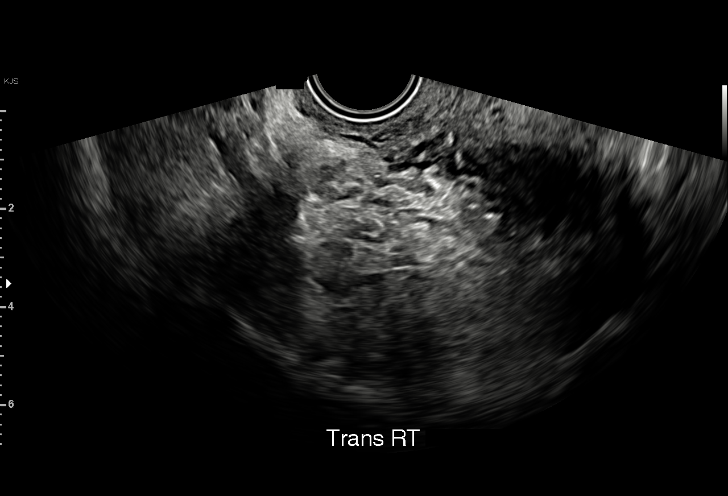
[im 60/78]
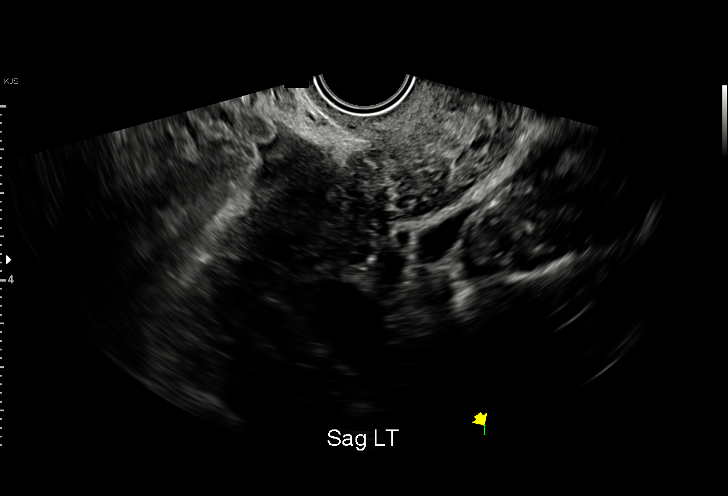
[im 66/78]
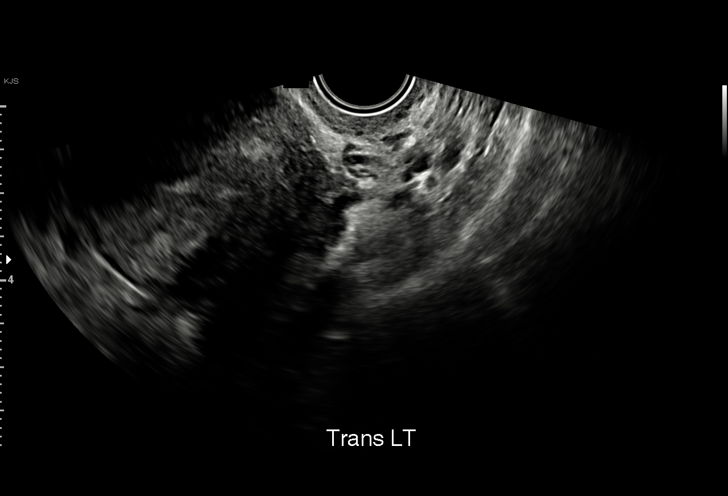
[im 72/78]
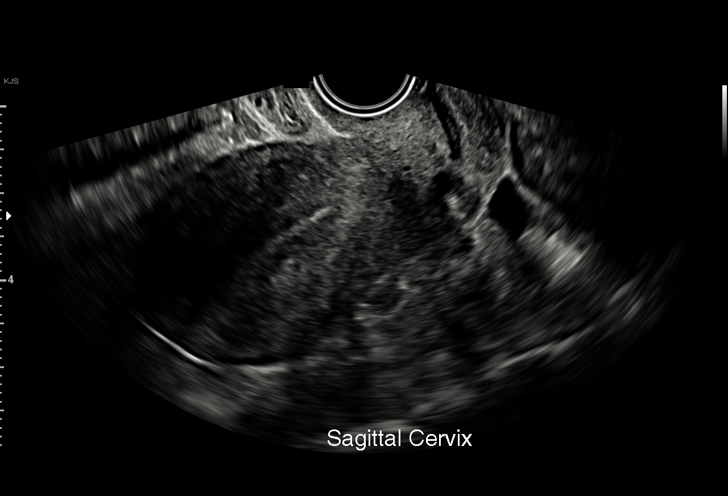
[im 78/78]
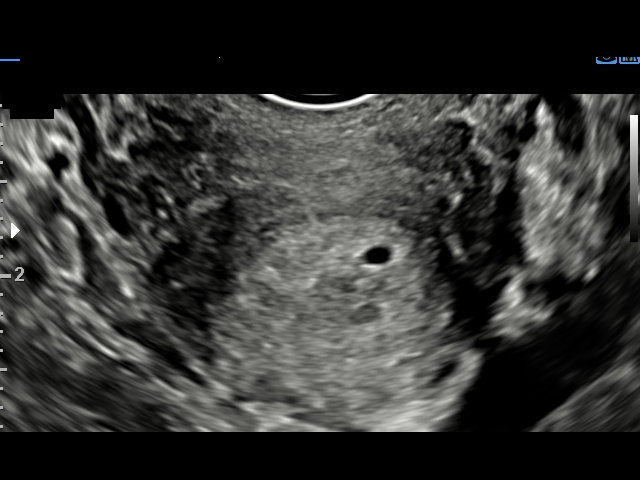

[15 of 28 positions shown; findings below may reference images not displayed]

FINDINGS: Intrauterine gestational sac: Probable small in the lower uterine
segment.

Yolk sac:  Not Visualized.

Embryo:  Not Visualized.

MSD: 3.6 mm   5 w   0  d

Subchorionic hemorrhage:  None visualized.

Maternal uterus/adnexae: Anteverted uterus, endometrium measures 12
mm. There is a probable small intrauterine gestational sac in the
lower uterine segment. The right ovary is visualized and is normal.
The left ovary is not seen. There is no adnexal mass. Trace free
fluid in the dependent pelvis.
IMPRESSION: 1. Probable early intrauterine gestational sac in the lower uterine
segment, but no yolk sac, fetal pole, or cardiac activity yet
visualized. Recommend follow-up quantitative B-HCG levels and
follow-up US in 14 days to assess viability. This recommendation
follows SRU consensus guidelines: Diagnostic Criteria for Nonviable
Pregnancy Early in the First Trimester. N Engl J Med 5598;
2. No sonographic findings of a caput pregnancy.

## 2023-10-18 IMAGING — US US OB TRANSVAGINAL
1 series · 15 of 28 positions shown · non-contrast
Comparison: Obstetric ultrasound 01/31/2022

CLINICAL DATA: Threatened miscarriage

EXAM:
TRANSVAGINAL OB ULTRASOUND
TECHNIQUE: Transvaginal ultrasound was performed for complete evaluation of the
gestation as well as the maternal uterus, adnexal regions, and
pelvic cul-de-sac.

[Series 1: us ob transvaginal · 71 acquisitions, 15 frames shown]
[im 1/71]
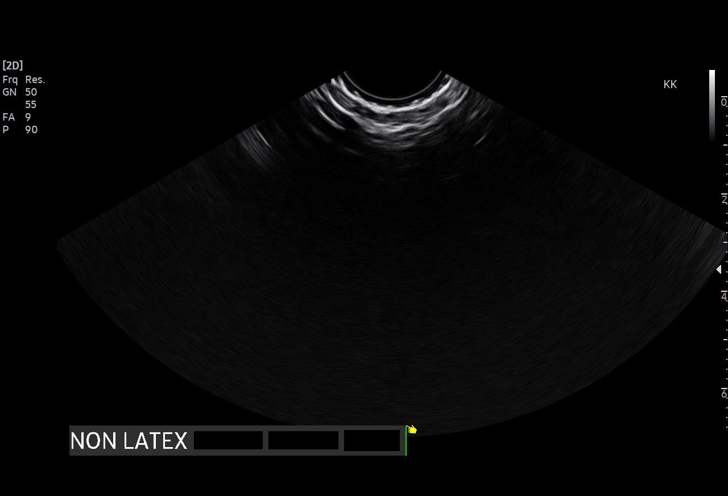
[im 6/71]
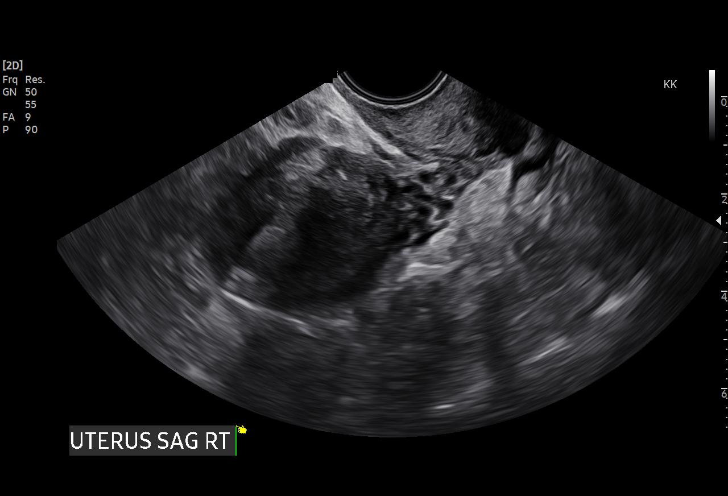
[im 11/71]
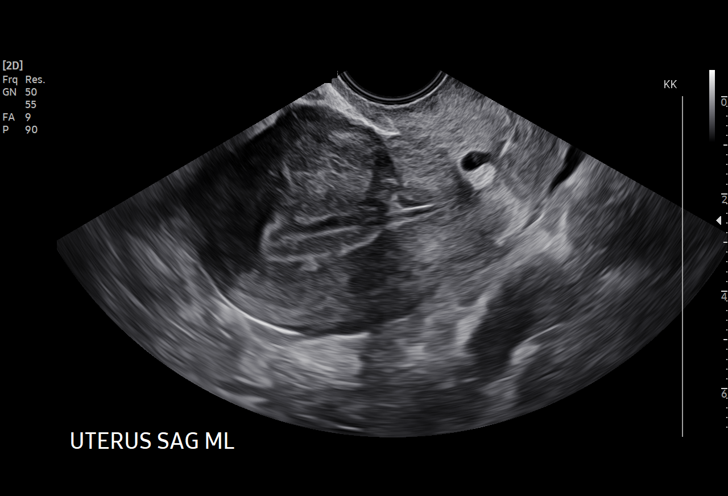
[im 16/71]
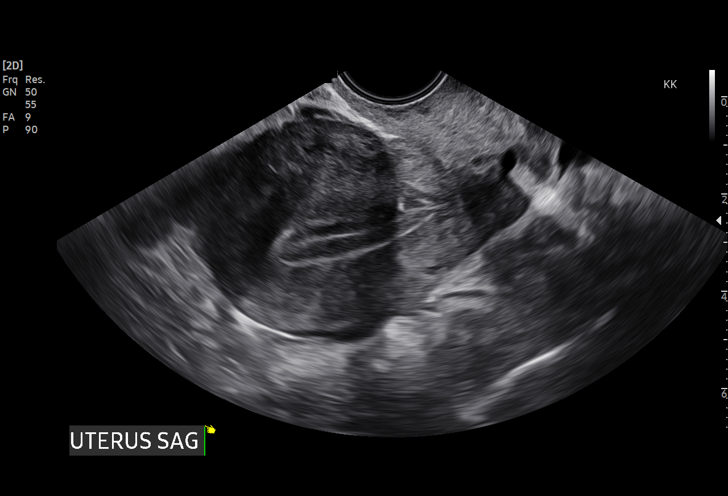
[im 21/71]
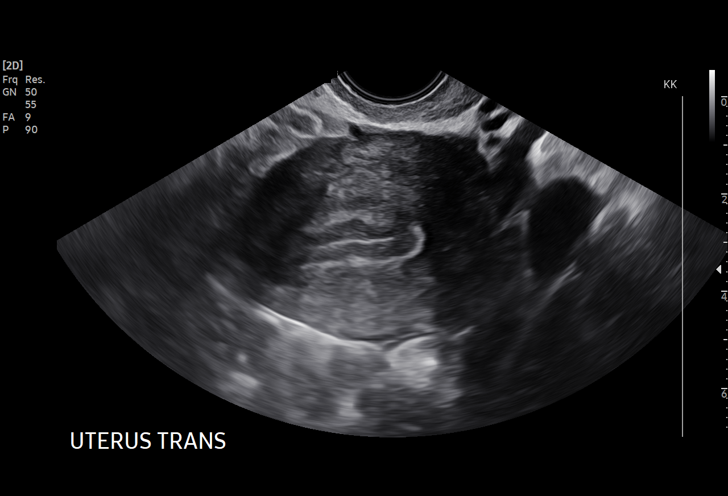
[im 26/71]
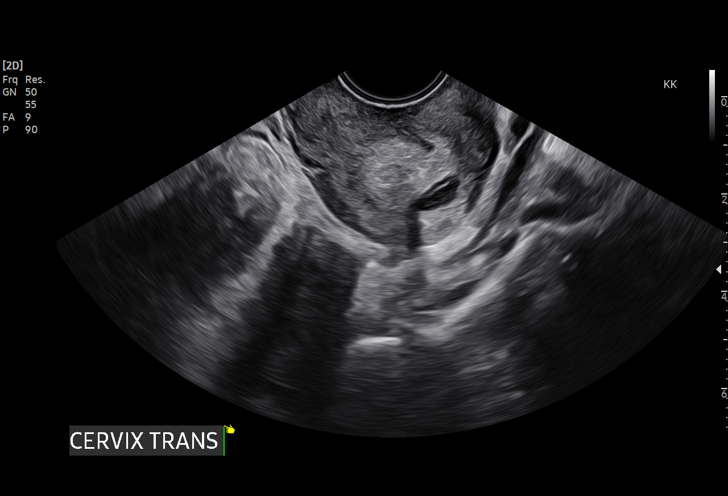
[im 32/71]
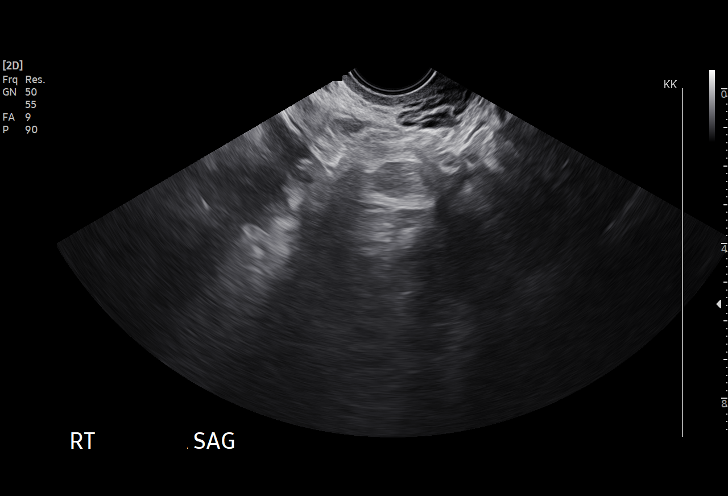
[im 37/71]
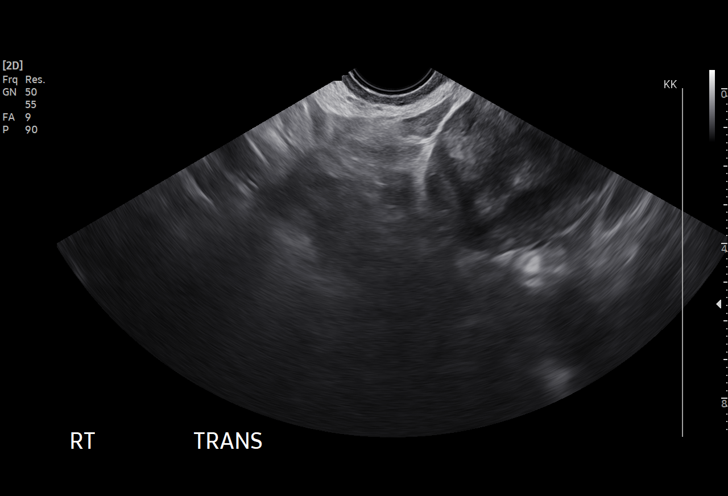
[im 39/71]
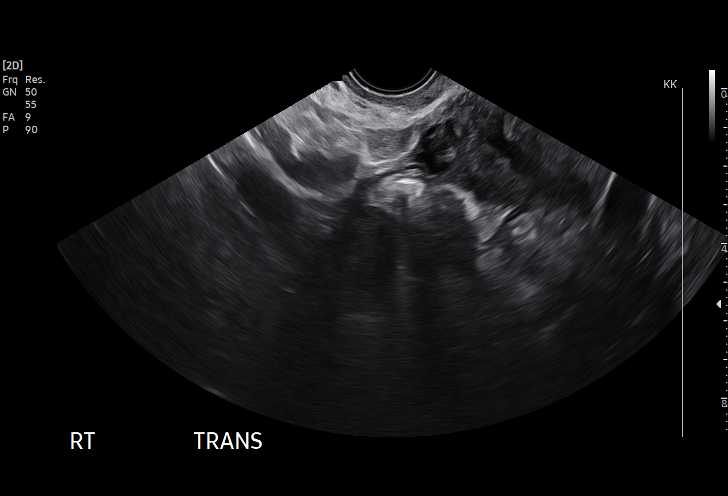
[im 45/71]
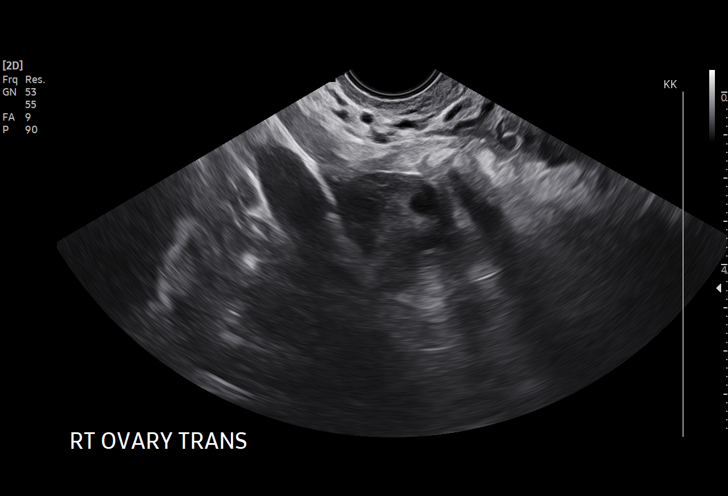
[im 50/71]
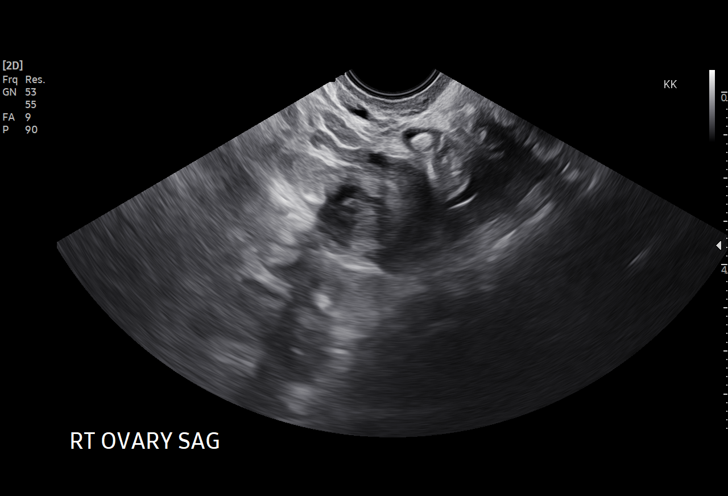
[im 55/71]
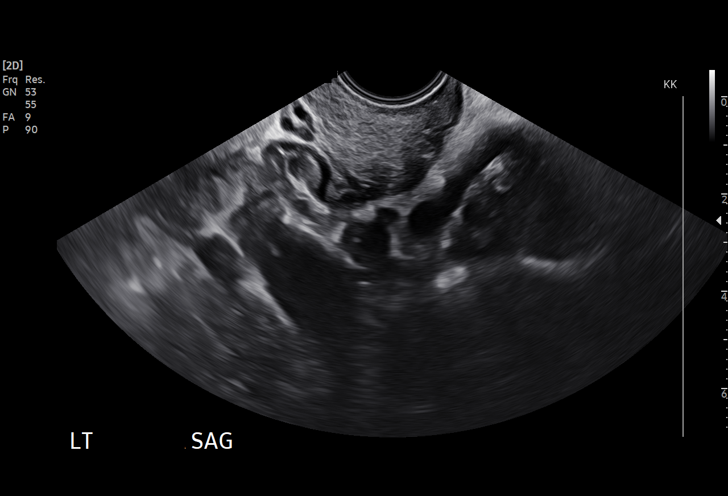
[im 60/71]
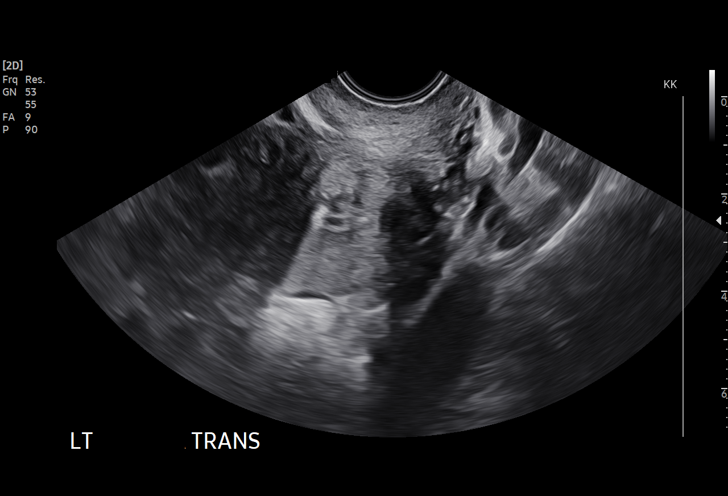
[im 65/71]
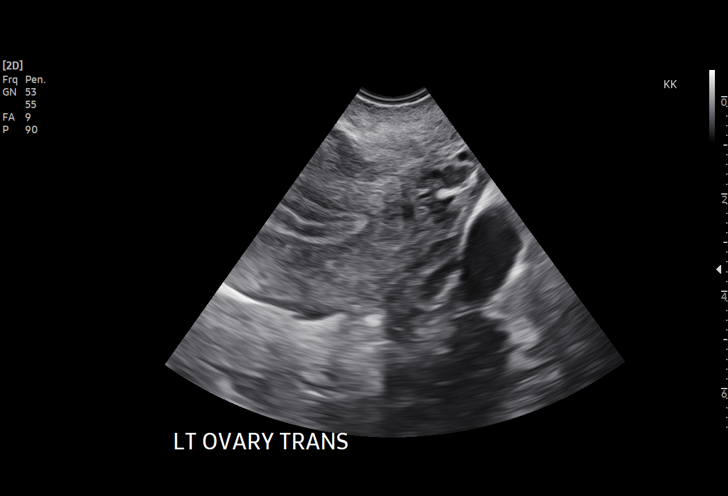
[im 71/71]
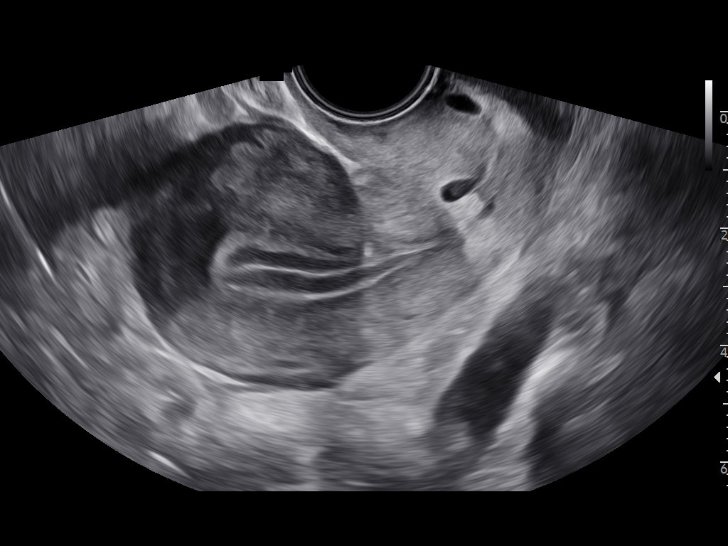

[15 of 28 positions shown; findings below may reference images not displayed]

FINDINGS: Intrauterine gestational sac: None

Yolk sac:  Not Visualized.

Embryo:  Not Visualized.

Cardiac Activity: Not Visualized.

Subchorionic hemorrhage:  None visualized.

Maternal uterus/adnexae: The right ovary is normal measuring 2.3 cm
by 3.0 cm x 2.2 cm. The left ovary is normal measuring 0.8 cm x
cm x 1.6 cm.

There is small volume free fluid in the pelvis.
IMPRESSION: 1. No intrauterine gestational sac identified. Findings most
suspicious for completed miscarriage. Recommend OB follow up and
correlation with bHCG.
2. Small volume free fluid in the pelvis.

## 2023-12-08 ENCOUNTER — Other Ambulatory Visit (HOSPITAL_COMMUNITY): Payer: Self-pay

## 2023-12-08 ENCOUNTER — Emergency Department (HOSPITAL_BASED_OUTPATIENT_CLINIC_OR_DEPARTMENT_OTHER)
Admission: EM | Admit: 2023-12-08 | Discharge: 2023-12-08 | Disposition: A | Discharge status: Home / Self Care | Payer: Medicaid Other | Attending: Emergency Medicine | Admitting: Emergency Medicine

## 2023-12-08 ENCOUNTER — Other Ambulatory Visit: Payer: Self-pay

## 2023-12-08 ENCOUNTER — Other Ambulatory Visit (HOSPITAL_BASED_OUTPATIENT_CLINIC_OR_DEPARTMENT_OTHER): Payer: Self-pay

## 2023-12-08 ENCOUNTER — Emergency Department (HOSPITAL_BASED_OUTPATIENT_CLINIC_OR_DEPARTMENT_OTHER): Payer: Medicaid Other

## 2023-12-08 DIAGNOSIS — Z7901 Long term (current) use of anticoagulants: Secondary | ICD-10-CM | POA: Insufficient documentation

## 2023-12-08 DIAGNOSIS — M79661 Pain in right lower leg: Secondary | ICD-10-CM | POA: Diagnosis present

## 2023-12-08 DIAGNOSIS — I82431 Acute embolism and thrombosis of right popliteal vein: Secondary | ICD-10-CM | POA: Diagnosis not present

## 2023-12-08 MED ORDER — ENOXAPARIN SODIUM 150 MG/ML IJ SOSY
150.0000 mg | PREFILLED_SYRINGE | Freq: Once | INTRAMUSCULAR | Status: AC
  Administered 2023-12-08: 150 mg via SUBCUTANEOUS
  Filled 2023-12-08: qty 1

## 2023-12-08 MED ORDER — ENOXAPARIN SODIUM 150 MG/ML IJ SOSY
150.0000 mg | PREFILLED_SYRINGE | Freq: Every day | INTRAMUSCULAR | 1 refills | Status: DC
  Filled 2023-12-08: qty 25, 25d supply, fill #0
  Filled 2023-12-08: qty 5, 5d supply, fill #0

## 2023-12-08 MED ORDER — ENOXAPARIN SODIUM 150 MG/ML IJ SOSY: 1.5000 mg/kg | PREFILLED_SYRINGE | INTRAMUSCULAR | 1 refills | Status: DC

## 2023-12-08 NOTE — ED Provider Notes (Signed)
[facility] Provider Note   CSN: [id number] Arrival date & time: 12/08/23  4098     History  Chief Complaint  Patient presents with   Leg Pain    Nicolasa Milbrath is a 38 y.o. female.  HPI 38 year old female with a history of prior thromboembolism presents with right calf pain and concern for DVT.  Started about 2 days ago, no trauma.  She reports that she is on Eliquis but misses about a dose or so per week.  She denies any chest pain or shortness of breath.  She denies any erythema or fever.  Home Medications Prior to Admission medications   Medication Sig Start Date End Date Taking? Authorizing Provider  enoxaparin (LOVENOX) 150 MG/ML injection Inject 1.04 mLs (156 mg total) into the skin daily. 12/08/23  Yes Pricilla Loveless, MD  Prenatal Vit-Fe Fumarate-FA (PRENATAL MULTIVITAMIN) TABS tablet Take 1 tablet by mouth daily at 12 noon.    [provider]      Allergies    Patient has no known allergies.    Review of Systems   Review of Systems  Constitutional:  Negative for fever.  Musculoskeletal:  Positive for myalgias.  Skin:  Negative for color change.    Physical Exam Updated Vital Signs BP (!) 149/95   Pulse 77   Temp 98.3 F (36.8 C) (Oral)   Resp 18   Wt 104.3 kg   LMP 10/31/2023   SpO2 98%   BMI 39.48 kg/m  Physical Exam Vitals and nursing note reviewed.  Constitutional:      Appearance: She is well-developed. She is obese.  HENT:     Head: Normocephalic and atraumatic.  Cardiovascular:     Rate and Rhythm: Normal rate and regular rhythm.     Pulses:          Dorsalis pedis pulses are detected w/ Doppler on the right side.  Pulmonary:     Effort: Pulmonary effort is normal.  Musculoskeletal:     Right knee: Normal range of motion. No tenderness.     Right lower leg: Tenderness present.     Right ankle: Normal range of motion.     Right Achilles Tendon: No tenderness or defects.      Comments: There is some mild generalized discomfort to palpation on the right calf.  I do not appreciate any erythema or swelling compared to the left side.  Skin:    General: Skin is warm and dry.  Neurological:     Mental Status: She is alert.     ED Results / Procedures / Treatments   Labs (all labs ordered are listed, but only abnormal results are displayed) Labs Reviewed - No data to display  EKG None  Radiology US Venous Img Lower Right (DVT Study) Result Date: 12/08/2023 CLINICAL DATA:  Right calf pain and swelling for 2 days EXAM: RIGHT LOWER EXTREMITY VENOUS DOPPLER ULTRASOUND TECHNIQUE: Gray-scale sonography with graded compression, as well as color Doppler and duplex ultrasound were performed to evaluate the lower extremity deep venous systems from the level of the common femoral vein and including the common femoral, femoral, profunda femoral, popliteal and calf veins including the posterior tibial, peroneal and gastrocnemius veins when visible. The superficial great saphenous vein was also interrogated. Spectral Doppler was utilized to evaluate flow at rest and with distal augmentation maneuvers in the common femoral, femoral and popliteal veins. COMPARISON:  05/31/2022 ultrasound FINDINGS: Contralateral Common Femoral Vein: Respiratory phasicity is  normal and symmetric with the symptomatic side. No evidence of thrombus. Normal compressibility. Common Femoral Vein: No evidence of thrombus. Normal compressibility, respiratory phasicity and response to augmentation. Saphenofemoral Junction: No evidence of thrombus. Normal compressibility and flow on color Doppler imaging. Profunda Femoral Vein: No evidence of thrombus. Normal compressibility and flow on color Doppler imaging. Femoral Vein: No evidence of thrombus. Normal compressibility, respiratory phasicity and response to augmentation. Popliteal Vein: There is poor flow and compression along the popliteal vein. Calf Veins: Poor flow  on compression along the calf veins. Superficial Great Saphenous Vein: No evidence of thrombus. Normal compressibility. Venous Reflux:  None. Other Findings: Critical Value/emergent results were called by telephone at the time of interpretation on 12/08/2023 at 11:07 am to provider Pricilla Loveless , who verbally acknowledged these results. IMPRESSION: Popliteal and calf vein occlusive thrombus Electronically Signed   By: Karen Kays M.D.   On: 12/08/2023 11:13    Procedures Procedures    Medications Ordered in ED Medications  enoxaparin (LOVENOX) injection 150 mg (150 mg Subcutaneous Given 12/08/23 1157)    ED Course/ Medical Decision Making/ A&P                                 Medical Decision Making Amount and/or Complexity of Data Reviewed External Data Reviewed: notes. Radiology: ordered and independent interpretation performed.    Details: +DVT  Risk Prescription drug management.   Patient presents with right leg pain and is found to have a popliteal and calf DVT.  Discussed results with radiology, Dr. Chales Abrahams.  When I discussed again with the patient, she tells me it is more like 3 or 4 times that she will miss doses of her Eliquis each week.  This is probably more of a compliance issue than a true treatment failure.  I discussed with hematology/oncology, Dr. Pamelia Hoit, who recommends that the patient either needs to be more compliant with her Eliquis or switch to lifelong Lovenox.  Given compliance issues he recommends the 1.5 mg/kg daily dose and follow-up in their clinic in about a week.  No indication at this time for IV heparin.  She has no signs or symptoms of pulmonary embolism.  After offering the options the patient, she would like to switch to Lovenox.  She was given a dose here.  Stressed the importance of compliance and she would like to follow-up with the [facility] hematology/oncology group.  Otherwise, appears stable for discharge home with return  precautions.         Final Clinical Impression(s) / ED Diagnoses Final diagnoses:  Acute deep vein thrombosis (DVT) of popliteal vein of right lower extremity (HCC)    Rx / DC Orders ED Discharge Orders          Ordered    enoxaparin (LOVENOX) 150 MG/ML injection  Every 24 hours        12/08/23 1142    Ambulatory referral to Hematology / Oncology       Comments: Your emergency department provider has referred you to see a hematology/oncology specialist. These are physicians who specialize in blood disorders and cancers, or findings concerning for cancer. You will receive a phone call from the [facility] to set up your appointment within 2 business days: [employer] operate Mon - Fri, 8:00 a.m. to 5:00 p.m.; closed for federally recognized holidays. Please be sure your phone is not set to block numbers during this time.  12/08/23 1142              Pricilla Loveless, MD 12/08/23 1241

## 2023-12-08 NOTE — Discharge Instructions (Addendum)
You have a blood clot in your right leg. STOP the Eliquis, we are switching you to a blood thinner given under the skin called Lovenox.  You will give this to your self once per day.  It is very important to be compliant with this medication or else you may have worsening or new blood clots.  Follow-up with the hematology/oncology specialists in about a week.  You may take Tylenol for pain but you may not take aspirin or NSAID such as ibuprofen, Advil, Motrin, etc.  If you develop new or worsening leg pain or swelling, chest pain, shortness of breath, or any other new/concerning symptoms then return to the ER or call 911.

## 2023-12-08 NOTE — ED Triage Notes (Signed)
Pt reports right lower leg pain and swelling x 3 days. Currently on eliquis for DVT

## 2023-12-08 NOTE — ED Notes (Signed)
Hx of DVT, RLL swelling wants evaluation.

## 2023-12-08 NOTE — ED Notes (Signed)

## 2023-12-11 ENCOUNTER — Other Ambulatory Visit: Payer: Self-pay

## 2023-12-11 ENCOUNTER — Other Ambulatory Visit (HOSPITAL_COMMUNITY): Payer: Self-pay

## 2023-12-12 ENCOUNTER — Other Ambulatory Visit (HOSPITAL_COMMUNITY): Payer: Self-pay

## 2023-12-28 ENCOUNTER — Inpatient Hospital Stay: Payer: Medicaid Other | Attending: Hematology & Oncology

## 2023-12-28 ENCOUNTER — Inpatient Hospital Stay (HOSPITAL_BASED_OUTPATIENT_CLINIC_OR_DEPARTMENT_OTHER): Payer: Medicaid Other | Admitting: Hematology & Oncology

## 2023-12-28 ENCOUNTER — Telehealth: Payer: Self-pay

## 2023-12-28 ENCOUNTER — Inpatient Hospital Stay: Payer: Medicaid Other

## 2023-12-28 ENCOUNTER — Other Ambulatory Visit: Payer: Self-pay

## 2023-12-28 VITALS — BP 152/95 | HR 69 | Temp 99.5°F | Resp 16 | Ht 64.0 in | Wt 254.0 lb

## 2023-12-28 DIAGNOSIS — I2699 Other pulmonary embolism without acute cor pulmonale: Secondary | ICD-10-CM | POA: Insufficient documentation

## 2023-12-28 DIAGNOSIS — I82431 Acute embolism and thrombosis of right popliteal vein: Secondary | ICD-10-CM

## 2023-12-28 DIAGNOSIS — Z7901 Long term (current) use of anticoagulants: Secondary | ICD-10-CM | POA: Diagnosis not present

## 2023-12-28 DIAGNOSIS — Z79899 Other long term (current) drug therapy: Secondary | ICD-10-CM | POA: Insufficient documentation

## 2023-12-28 DIAGNOSIS — D6859 Other primary thrombophilia: Secondary | ICD-10-CM | POA: Diagnosis not present

## 2023-12-28 DIAGNOSIS — M7989 Other specified soft tissue disorders: Secondary | ICD-10-CM | POA: Diagnosis not present

## 2023-12-28 LAB — CMP (CANCER CENTER ONLY)
ALT: 61 U/L — ABNORMAL HIGH (ref 0–44)
AST: 30 U/L (ref 15–41)
Albumin: 4.1 g/dL (ref 3.5–5.0)
Alkaline Phosphatase: 34 U/L — ABNORMAL LOW (ref 38–126)
Anion gap: 5 (ref 5–15)
BUN: 13 mg/dL (ref 6–20)
CO2: 27 mmol/L (ref 22–32)
Calcium: 8.9 mg/dL (ref 8.9–10.3)
Chloride: 106 mmol/L (ref 98–111)
Creatinine: 0.5 mg/dL (ref 0.44–1.00)
GFR, Estimated: >60 mL/min (ref >60)
Glucose, Bld: 79 mg/dL (ref 70–99)
Potassium: 3.7 mmol/L (ref 3.5–5.1)
Sodium: 138 mmol/L (ref 135–145)
Total Bilirubin: 0.6 mg/dL (ref 0.0–1.2)
Total Protein: 7.3 g/dL (ref 6.5–8.1)

## 2023-12-28 LAB — CBC WITH DIFFERENTIAL (CANCER CENTER ONLY)
Abs Immature Granulocytes: 0.01 10*3/uL (ref 0.00–0.07)
Basophils Absolute: 0.1 10*3/uL (ref 0.0–0.1)
Basophils Relative: 1 %
Eosinophils Absolute: 0.1 10*3/uL (ref 0.0–0.5)
Eosinophils Relative: 1 %
HCT: 37.1 % (ref 36.0–46.0)
Hemoglobin: 11.9 g/dL — ABNORMAL LOW (ref 12.0–15.0)
Immature Granulocytes: 0 %
Lymphocytes Relative: 32 %
Lymphs Abs: 1.2 10*3/uL (ref 0.7–4.0)
MCH: 27.3 pg (ref 26.0–34.0)
MCHC: 32.1 g/dL (ref 30.0–36.0)
MCV: 85.1 fL (ref 80.0–100.0)
Monocytes Absolute: 0.3 10*3/uL (ref 0.1–1.0)
Monocytes Relative: 8 %
Neutro Abs: 2.2 10*3/uL (ref 1.7–7.7)
Neutrophils Relative %: 58 %
Platelet Count: 260 10*3/uL (ref 150–400)
RBC: 4.36 MIL/uL (ref 3.87–5.11)
RDW: 14.1 % (ref 11.5–15.5)
WBC Count: 3.8 10*3/uL — ABNORMAL LOW (ref 4.0–10.5)
nRBC: 0 % (ref 0.0–0.2)

## 2023-12-28 LAB — ANTITHROMBIN III: AntiThromb III Func: 97 % (ref 75–120)

## 2023-12-28 LAB — PROTIME-INR
INR: 1.5 — ABNORMAL HIGH (ref 0.8–1.2)
Prothrombin Time: 18.4 s — ABNORMAL HIGH (ref 11.4–15.2)

## 2023-12-28 NOTE — Telephone Encounter (Signed)
-----   Message from Josph Macho sent at 12/28/2023  4:42 PM EST ----- We need to increase her Coumadin to 10 mg a day.  She has to stay on the Lovenox.  We had to repeat the PTT/INR in 5 days.  Cindee Lame

## 2023-12-28 NOTE — Progress Notes (Signed)
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  HPI: Christie Hernandez is a very nice 38 year old Afro-American female.  She has a very interesting history.  She apparently said multiple blood clots.  She had her first blood clot back in 2015.  She says there was a pulmonary embolism.  She said she was on blood thinner for 3 months.  In 2018, she had another pulmonary embolism.  She was then placed on Eliquis..  She says she has been taking the Eliquis.  She has had no problems with taking hormones.  She is not diabetic.  She does not smoke.  Recently, she had another thromboembolic event.  She actually went out to [facility].  She had a CT angiogram that was done on 12/10/2023.  This showed that she had a new partially occlusive right-sided pulmonary embolus involving the right lower lobe.  She had a ultrasound done of her legs.  She was found to have a occlusive thrombus in the right tibioperoneal trunk extending into the peroneal veins.  The left side was fine.  She was placed on Lovenox and Coumadin.  She was then kindly referred to the [facility].  There is history of blood clots in the family.  There is history of cancer in the family.  She does not like taking the Lovenox.  She is only on Lovenox 150 mg daily now.  She had been on it twice a day.  However she says she was told at [facility] to only take it once a day while she is on Coumadin.  She saw a hematologist at [facility].  I am not sure exactly what was recommended outside failure should be on Coumadin.  She has a 65 year old son.  He was born prematurely.  Apparently was born 2 months prematurely.  She also has 1 miscarriage.  Back in 2018, she had a hypercoagulable panel done.  This did show a mildly low Protein S 44.  We will have to repeat this.  Currently,  she is having problems with the right leg.  I think she has lymphedema.  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  She does not work because of I think psychological issues.  Currently, her performance status is ECOG 1.  Past Medical History:  Diagnosis Date   Pain and swelling of lower extremity    Pulmonary emboli (HCC)   :   Past Surgical History:  Procedure Laterality Date   CESAREAN SECTION    :   Current Outpatient Medications:    enoxaparin (LOVENOX) 120 MG/0.8ML injection, Inject 120 mg into the skin daily., Disp: , Rfl:    hydrochlorothiazide (HYDRODIURIL) 25 MG tablet, Take 25 mg by mouth daily as needed., Disp: , Rfl:    MULTIPLE VITAMINS-MINERALS PO, Take 1 tablet by mouth daily at 6 (six) AM., Disp: , Rfl:    phentermine (ADIPEX-P) 37.5 MG tablet, Take 37.5 mg by mouth daily before breakfast., Disp: , Rfl:    warfarin (COUMADIN) 5 MG tablet, Take 5 mg by mouth daily., Disp: , Rfl: :  :   Allergies  Allergen Reactions   Hydrocodone-Acetaminophen Hives and Itching  :  No family history on file.:   Social History   Socioeconomic History   Marital status: Single    Spouse name: Not on file   Number of children: Not on  file   Years of education: Not on file   Highest education level: Not on file  Occupational History   Not on file  Tobacco Use   Smoking status: Never   Smokeless tobacco: Never  Substance and Sexual Activity   Alcohol use: No   Drug use: No   Sexual activity: Yes    Birth control/protection: None  Other Topics Concern   Not on file  Social History Narrative   Not on file   Social Drivers of Health   Financial Resource Strain: Low Risk  (12/25/2023)   Received from [facility]   Overall Financial Resource Strain (CARDIA)    Difficulty of Paying Living Expenses: Not hard at all  Food Insecurity: No Food Insecurity (12/25/2023)   Received from [facility]   Hunger Vital Sign    Worried About Running Out of Food in the Last Year:  Never true    Ran Out of Food in the Last Year: Never true  Transportation Needs: No Transportation Needs (12/25/2023)   Received from [facility] - Transportation    Lack of Transportation (Medical): No    Lack of Transportation (Non-Medical): No  Physical Activity: Not on File (03/03/2022)   Received from [place], [place]   Physical Activity    Physical Activity: 0  Stress: Stress Concern Present (12/11/2023)   Received from [facility] of Occupational Health - Occupational Stress Questionnaire    Feeling of Stress : To some extent  Social Connections: Not on File (07/29/2023)   Received from [facility]   Social Connections    Connectedness: 0  Intimate Partner Violence: Not At Risk (12/10/2023)   Received from [facility]   HITS    Over the last 12 months how often did your partner physically hurt you?: Never    Over the last 12 months how often did your partner insult you or talk down to you?: Never    Over the last 12 months how often did your partner threaten you with physical harm?: Never    Over the last 12 months how often did your partner scream or curse at you?: Never  :  Review of Systems  Constitutional: Negative.   HENT: Negative.    Eyes: Negative.   Respiratory: Negative.    Cardiovascular:  Positive for leg swelling.  Gastrointestinal: Negative.   Genitourinary: Negative.   Musculoskeletal:  Positive for myalgias.  Skin: Negative.   Neurological: Negative.   Psychiatric/Behavioral:  Positive for depression. The patient is nervous/anxious.      Exam: Vital signs show temperature of 99.5.  Pulse 69.  Blood pressure 152/95.  Weight is 254 pounds.  @IPVITALS @ Physical Exam Vitals reviewed.  HENT:     Head: Normocephalic and atraumatic.  Eyes:     Pupils: Pupils are equal, round, and reactive to light.  Cardiovascular:     Rate and Rhythm: Normal rate and regular rhythm.     Heart sounds: Normal heart sounds.     Comments:  Cardiac exam is regular rate and rhythm with no murmurs, rubs or bruits. Pulmonary:     Effort: Pulmonary effort is normal.     Breath sounds: Normal breath sounds.     Comments: Her lungs sound clear bilaterally.  She has good air movement bilaterally.  There is no wheezing or rales or rhonchi. Abdominal:     General: Bowel sounds are normal.     Palpations: Abdomen is soft.  Comments: Abdomen is soft.  She is obese.  There is no fluid wave.  There is no guarding or rebound tenderness.  There is no palpable abdominal mass.  There is no palpable liver or spleen tip.  Musculoskeletal:        General: No tenderness or deformity. Normal range of motion.     Cervical back: Normal range of motion.     Comments: Extremities does show some swelling in the right leg.  She is little bit of tenderness in the right leg to palpation.  It is hard to tell if there is a Homans sign with the right leg.  Left leg is unremarkable.  Lymphadenopathy:     Cervical: No cervical adenopathy.  Skin:    General: Skin is warm and dry.     Findings: No erythema or rash.  Neurological:     Mental Status: She is alert and oriented to person, place, and time.  Psychiatric:        Behavior: Behavior normal.        Thought Content: Thought content normal.        Judgment: Judgment normal.     Recent Labs    12/28/23 1312  WBC 3.8*  HGB 11.9*  HCT 37.1  PLT 260    Recent Labs    12/28/23 1312  NA 138  K 3.7  CL 106  CO2 27  GLUCOSE 79  BUN 13  CREATININE 0.50  CALCIUM 8.9    Blood smear review: None  Pathology: None    Assessment and Plan: Christie Hernandez is a very nice 38 year old African-American female.  She has another thromboembolic event.  She has been on Eliquis.  Again she says she was diligent with taking the Eliquis.  I am repeating a hypercoagulable panel on her.  She has an INR of one 1.5 today.  We going to have to increase the Coumadin to 10 mg a day.  I think in her case, she  is going need to have an INR of 2.5-3.5.  She is going need lifelong anticoagulation.  She will continue on the Lovenox for right now.  1 other option for her might be Pradaxa.  This has a different mechanism of action that Eliquis/Xarelto.  This certainly could be a consideration.  I suspect that she we will have chronic pain in the right leg.  I suspect that she will have some chronic swelling in the right leg.  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  I am not sure there is much we can do about that.  Clearly, she is at risk for recurrent disease.  I really hate this for her.  We will have to have her INR checked in a week.  This is really going to be labor-intensive with respect to getting her INR therapeutic and keeping it therapeutic.  I will plan to see her back in about a month.

## 2023-12-28 NOTE — Telephone Encounter (Signed)
Advised via MyChart.

## 2023-12-29 ENCOUNTER — Telehealth: Payer: Self-pay

## 2023-12-29 DIAGNOSIS — I82431 Acute embolism and thrombosis of right popliteal vein: Secondary | ICD-10-CM

## 2023-12-29 LAB — CARDIOLIPIN ANTIBODIES, IGG, IGM, IGA
Anticardiolipin IgA: <9 [APL'U]/mL (ref 0–11)
Anticardiolipin IgG: <9 [GPL'U]/mL (ref 0–14)
Anticardiolipin IgM: <9 [MPL'U]/mL (ref 0–12)

## 2023-12-29 LAB — PROTEIN S, TOTAL: Protein S Ag, Total: 38 % — ABNORMAL LOW (ref 60–150)

## 2023-12-29 LAB — HOMOCYSTEINE: Homocysteine: 7.3 umol/L (ref 0.0–14.5)

## 2023-12-29 LAB — LUPUS ANTICOAGULANT PANEL
DRVVT: 38.1 s (ref 0.0–47.0)
PTT Lupus Anticoagulant: 34.2 s (ref 0.0–43.5)

## 2023-12-29 LAB — PROTEIN C ACTIVITY: Protein C Activity: 96 % (ref 73–180)

## 2023-12-29 LAB — PROTEIN S ACTIVITY: Protein S Activity: 13 % — ABNORMAL LOW (ref 63–140)

## 2023-12-29 MED ORDER — WARFARIN SODIUM 10 MG PO TABS
10.0000 mg | ORAL_TABLET | Freq: Every day | ORAL | 2 refills | Status: DC
Start: 2023-12-29 — End: 2024-03-18

## 2023-12-29 MED ORDER — WARFARIN SODIUM 5 MG PO TABS: 5.0000 mg | ORAL_TABLET | Freq: Every day | ORAL | 2 refills | Status: DC

## 2023-12-29 NOTE — Telephone Encounter (Signed)
-----   Message from Josph Macho sent at 12/28/2023  4:42 PM EST ----- We need to increase her Coumadin to 10 mg a day.  She has to stay on the Lovenox.  We had to repeat the PTT/INR in 5 days.  Cindee Lame

## 2023-12-29 NOTE — Telephone Encounter (Signed)
This encounter was created in error - please disregard.

## 2023-12-29 NOTE — Telephone Encounter (Signed)
error    This encounter was created in error - please disregard.

## 2023-12-29 NOTE — Telephone Encounter (Signed)
Order sent for Coumadin 5 mg by error- cancelled and resent for 10 mg. -MM

## 2023-12-30 DIAGNOSIS — Z86711 Personal history of pulmonary embolism: Secondary | ICD-10-CM | POA: Insufficient documentation

## 2023-12-30 LAB — BETA-2-GLYCOPROTEIN I ABS, IGG/M/A
Beta-2 Glyco I IgG: <9 GPI IgG units (ref 0–20)
Beta-2-Glycoprotein I IgA: <9 GPI IgA units (ref 0–25)
Beta-2-Glycoprotein I IgM: <9 GPI IgM units (ref 0–32)

## 2023-12-30 LAB — PROTEIN C, TOTAL: Protein C, Total: 72 % (ref 60–150)

## 2024-01-01 ENCOUNTER — Encounter: Payer: Self-pay | Admitting: *Deleted

## 2024-01-02 ENCOUNTER — Inpatient Hospital Stay: Payer: Medicaid Other

## 2024-01-02 DIAGNOSIS — I2699 Other pulmonary embolism without acute cor pulmonale: Secondary | ICD-10-CM | POA: Diagnosis not present

## 2024-01-02 DIAGNOSIS — I82431 Acute embolism and thrombosis of right popliteal vein: Secondary | ICD-10-CM

## 2024-01-02 LAB — PROTIME-INR
INR: 3.4 — ABNORMAL HIGH (ref 0.8–1.2)
Prothrombin Time: 34.3 s — ABNORMAL HIGH (ref 11.4–15.2)

## 2024-01-03 ENCOUNTER — Encounter: Payer: Self-pay | Admitting: Hematology & Oncology

## 2024-01-03 DIAGNOSIS — I82431 Acute embolism and thrombosis of right popliteal vein: Secondary | ICD-10-CM

## 2024-01-05 LAB — PROTHROMBIN GENE MUTATION

## 2024-01-05 LAB — FACTOR 5 LEIDEN

## 2024-01-08 MED ORDER — WARFARIN SODIUM 5 MG PO TABS
5.0000 mg | ORAL_TABLET | ORAL | 2 refills | Status: DC
Start: 2024-01-08 — End: 2024-02-19

## 2024-01-09 ENCOUNTER — Encounter: Payer: Self-pay | Admitting: Hematology & Oncology

## 2024-01-09 ENCOUNTER — Inpatient Hospital Stay: Payer: Medicaid Other

## 2024-01-09 DIAGNOSIS — I2699 Other pulmonary embolism without acute cor pulmonale: Secondary | ICD-10-CM | POA: Diagnosis not present

## 2024-01-09 DIAGNOSIS — I82431 Acute embolism and thrombosis of right popliteal vein: Secondary | ICD-10-CM

## 2024-01-09 LAB — PROTIME-INR
INR: 2.9 — ABNORMAL HIGH (ref 0.8–1.2)
Prothrombin Time: 30.2 s — ABNORMAL HIGH (ref 11.4–15.2)

## 2024-01-16 ENCOUNTER — Inpatient Hospital Stay: Payer: Medicaid Other | Attending: Hematology & Oncology

## 2024-01-23 ENCOUNTER — Inpatient Hospital Stay: Payer: Medicaid Other | Admitting: Hematology & Oncology

## 2024-01-23 ENCOUNTER — Inpatient Hospital Stay: Payer: Medicaid Other

## 2024-01-23 ENCOUNTER — Inpatient Hospital Stay: Payer: Medicaid Other | Admitting: Family

## 2024-02-01 ENCOUNTER — Encounter: Payer: Self-pay | Admitting: *Deleted

## 2024-02-02 ENCOUNTER — Inpatient Hospital Stay: Admitting: Family

## 2024-02-02 ENCOUNTER — Inpatient Hospital Stay

## 2024-02-09 ENCOUNTER — Encounter: Payer: Self-pay | Admitting: *Deleted

## 2024-02-19 ENCOUNTER — Ambulatory Visit: Payer: Medicaid Other | Admitting: Advanced Practice Midwife

## 2024-02-19 ENCOUNTER — Encounter: Payer: Self-pay | Admitting: Advanced Practice Midwife

## 2024-02-19 ENCOUNTER — Other Ambulatory Visit (HOSPITAL_COMMUNITY)
Admission: RE | Admit: 2024-02-19 | Discharge: 2024-02-19 | Disposition: A | Discharge status: Home / Self Care | Source: Ambulatory Visit | Attending: Advanced Practice Midwife | Admitting: Advanced Practice Midwife

## 2024-02-19 ENCOUNTER — Telehealth: Payer: Self-pay

## 2024-02-19 VITALS — BP 139/93 | HR 99 | Ht 64.0 in | Wt 272.0 lb

## 2024-02-19 DIAGNOSIS — I82409 Acute embolism and thrombosis of unspecified deep veins of unspecified lower extremity: Secondary | ICD-10-CM | POA: Insufficient documentation

## 2024-02-19 DIAGNOSIS — Z124 Encounter for screening for malignant neoplasm of cervix: Secondary | ICD-10-CM

## 2024-02-19 DIAGNOSIS — Z113 Encounter for screening for infections with a predominantly sexual mode of transmission: Secondary | ICD-10-CM | POA: Insufficient documentation

## 2024-02-19 DIAGNOSIS — Z01419 Encounter for gynecological examination (general) (routine) without abnormal findings: Secondary | ICD-10-CM | POA: Diagnosis not present

## 2024-02-19 DIAGNOSIS — L680 Hirsutism: Secondary | ICD-10-CM

## 2024-02-19 NOTE — Progress Notes (Signed)
 Subjective:     Christie Hernandez is a 38 y.o. female here at [facility] for a routine exam.  Current complaints: Hirsutism.  Personal and family health history reviewed: yes.  Do you have a primary care provider? Yes, "a new person in [place]"  Patient reports 2-3 years ago she started growing facial hair around her chin. It has worsened in the last year. She reports normal menses that started around 60-34 years old. Menses is usually 6-7 days sometimes has heavy bleeding and cramping. She denies use of birth control. She reports that she is celibate, but would like STD screening today both vaginal swabs and blood work.   She wears her seatbelt correctly every time. Patient reports she has not exercised since her blood clot.   Flowsheet Row Office Visit from 02/19/2024 in [facility] [employer] [facility] [facility]  PHQ-2 Total Score 1       Health Maintenance Due  Topic Date Due   HIV Screening  Never done   Hepatitis C Screening  Never done   DTaP/Tdap/Td (1 - Tdap) Never done   COVID-19 Vaccine (1 - 2024-25 season) Never done    Essential hypertension on hydrochlorothiazide  Smoking: never Pt BMI: Body mass index is 46.69 kg/m.   Gynecologic History Patient's last menstrual period was 02/10/2024 (approximate). Contraception: abstinence Last Pap: 05/25/2022. Results were: normal Last mammogram: not indicated.   Obstetric History OB History  Gravida Para Term Preterm AB Living  2 1 0 1  1  SAB IAB Ectopic Multiple Live Births      1    # Outcome Date GA Lbr Len/2nd Weight Sex Type Anes PTL Lv  2 Gravida           1 Preterm 10/17/09     CS-Unspec        The following portions of the patient's history were reviewed and updated as appropriate: allergies, current medications, past family history, past medical history, past social history, past surgical history, and problem list.  Review of Systems Pertinent items are noted in HPI.     Objective:  BP (!) 139/93   Pulse 99   Ht 5\' 4"  (1.626 m)   Wt 272 lb (123.4 kg)   LMP 02/10/2024 (Approximate)   BMI 46.69 kg/m   VS reviewed, nursing note reviewed,  Constitutional: well developed, well nourished, no distress HEENT: normocephalic, thyroid without enlargement or mass HEART: RRR, no murmurs rubs/gallops RESP: clear and equal to auscultation bilaterally in all lobes  Breast Exam: recommendation to start mammogram between 40-50 yo/ exam performed: right breast normal without mass, skin or nipple changes or axillary nodes, left breast normal without mass, skin or nipple changes or axillary nodes Abdomen: soft Neuro: alert and oriented x 3 Skin: warm, dry Psych: affect normal Pelvic exam: Performed: Cervix pink, visually fingertip, without lesion, scant white creamy discharge, vaginal walls and external genitalia normal Bimanual exam: fingertip/long/high, firm, anterior, neg CMT, uterus nontender, nonenlarged, adnexa without tenderness, enlargement, or mass      Assessment/Plan:   1. Cervical cancer screening (Primary) - Cytology - PAP( [place])  2. Screening for STD (sexually transmitted disease) - Cytology - PAP( [place]) - HIV Antibody (routine testing w rflx) - RPR - Hepatitis B Surface AntiGEN - Hepatitis C Antibody  3. Hirsutism - TSH - FSH - LH - Testosterone - Prolactin      Nikki Risheq, Student-NP 3:20 PM   CNM attestation:  I have  seen and examined this patient; I agree with above documentation in the NP student's note.   Christie Hernandez is a 38 y.o. G2P0101 in the [facility] office for routine gyn visit.  See problem list below.   Patient Active Problem List   Diagnosis Date Noted   DVT (deep venous thrombosis) (HCC) 02/19/2024   History of pulmonary embolism 12/30/2023   Essential hypertension 08/11/2017   Morbid obesity with BMI of 60.0-69.9, adult (HCC) 08/11/2017     ROS, labs, PMH reviewed  PE: BP (!) 139/93   Pulse  99   Ht 5\' 4"  (1.626 m)   Wt 272 lb (123.4 kg)   LMP 02/10/2024 (Approximate)   BMI 46.69 kg/m  Gen: calm comfortable, well appearing Resp: normal effort, no distress Abd: soft/nontender Pelvic: wnl, Pap performed with NP student   Plan: 1. Cervical cancer screening (Primary)  - Cytology - PAP( [place])  2. Screening for STD (sexually transmitted disease)  - Cytology - PAP( [place]) - HIV Antibody (routine testing w rflx) - RPR - Hepatitis B Surface AntiGEN - Hepatitis C Antibody  3. Hirsutism --Fairly regular menses, but pt concerned about worsening hair on her face.  Labs collected. If no lab abnormalities, pt to f/u with PCP.  - TSH - FSH - LH - Testosterone - Prolactin      Sharen Counter, CNM 5:58 PM

## 2024-02-19 NOTE — Telephone Encounter (Signed)
 Received call from [employer] [facility] stating that  pt's script for home INR machine has been received and is in currently in their benefits department to see if she qualifies. dph

## 2024-02-20 ENCOUNTER — Encounter: Payer: Self-pay | Admitting: Advanced Practice Midwife

## 2024-02-20 LAB — TESTOSTERONE: Testosterone: 45 ng/dL (ref 8–60)

## 2024-02-20 LAB — LUTEINIZING HORMONE: LH: 4.9 m[IU]/mL

## 2024-02-20 LAB — RPR: RPR Ser Ql: NONREACTIVE

## 2024-02-20 LAB — HIV ANTIBODY (ROUTINE TESTING W REFLEX): HIV Screen 4th Generation wRfx: NONREACTIVE

## 2024-02-20 LAB — HEPATITIS C ANTIBODY: Hep C Virus Ab: NONREACTIVE

## 2024-02-20 LAB — HEPATITIS B SURFACE ANTIGEN: Hepatitis B Surface Ag: NEGATIVE

## 2024-02-20 LAB — FOLLICLE STIMULATING HORMONE: FSH: 5.9 m[IU]/mL

## 2024-02-20 LAB — PROLACTIN: Prolactin: 2.8 ng/mL — ABNORMAL LOW (ref 4.8–33.4)

## 2024-02-20 LAB — TSH: TSH: 0.558 u[IU]/mL (ref 0.450–4.500)

## 2024-02-22 LAB — CYTOLOGY - PAP
Chlamydia: NEGATIVE
Comment: NEGATIVE
Comment: NEGATIVE
Comment: NEGATIVE
Comment: NEGATIVE
Comment: NEGATIVE
Comment: NORMAL
HPV 16: NEGATIVE
HPV 18 / 45: NEGATIVE
High risk HPV: POSITIVE — AB
Neisseria Gonorrhea: NEGATIVE
Trichomonas: NEGATIVE

## 2024-03-16 ENCOUNTER — Other Ambulatory Visit: Payer: Self-pay | Admitting: Hematology & Oncology

## 2024-03-16 DIAGNOSIS — I82431 Acute embolism and thrombosis of right popliteal vein: Secondary | ICD-10-CM

## 2024-04-01 ENCOUNTER — Ambulatory Visit: Admitting: Obstetrics and Gynecology

## 2024-04-22 ENCOUNTER — Inpatient Hospital Stay: Attending: Hematology & Oncology

## 2024-04-22 ENCOUNTER — Inpatient Hospital Stay: Admitting: Family

## 2024-04-24 NOTE — Progress Notes (Signed)
    GYNECOLOGY OFFICE COLPOSCOPY PROCEDURE NOTE  38 y.o. G2P0101 here for colposcopy for low-grade squamous intraepithelial neoplasia (LGSIL - encompassing HPV,mild dysplasia,CIN I) pap smear on 02/19/24. Discussed role for HPV in cervical dysplasia, need for surveillance.  Patient gave informed written consent, time out was performed.  Placed in lithotomy position. Cervix viewed with speculum and colposcope after application of acetic acid.   Colposcopy adequate? Yes  Visible erythematous lesion(s) at 1 o'clock of ectocervix otherwise normal appearing cervical os; corresponding biopsies obtained.  ECC specimen obtained. All specimens were labeled and sent to pathology.  Chaperone was present during entire procedure.  Patient was given post procedure instructions.  Will follow up pathology and manage accordingly; patient will be contacted with results and recommendations.  Routine preventative health maintenance measures emphasized.   Kiki Pelton, MD, FACOG Minimally Invasive Gynecologic Surgery  Obstetrics and Gynecology, [facility] for [facility], [facility] 04/24/2024

## 2024-04-25 ENCOUNTER — Ambulatory Visit: Admitting: Obstetrics and Gynecology

## 2024-04-25 ENCOUNTER — Other Ambulatory Visit (HOSPITAL_COMMUNITY)
Admission: RE | Admit: 2024-04-25 | Discharge: 2024-04-25 | Disposition: A | Discharge status: Home / Self Care | Source: Ambulatory Visit | Attending: Obstetrics and Gynecology | Admitting: Obstetrics and Gynecology

## 2024-04-25 VITALS — BP 172/102 | HR 72 | Wt 285.0 lb

## 2024-04-25 DIAGNOSIS — Z3202 Encounter for pregnancy test, result negative: Secondary | ICD-10-CM | POA: Diagnosis not present

## 2024-04-25 DIAGNOSIS — R8789 Other abnormal findings in specimens from female genital organs: Secondary | ICD-10-CM | POA: Diagnosis not present

## 2024-04-25 DIAGNOSIS — Z1331 Encounter for screening for depression: Secondary | ICD-10-CM

## 2024-04-25 DIAGNOSIS — R87622 Low grade squamous intraepithelial lesion on cytologic smear of vagina (LGSIL): Secondary | ICD-10-CM

## 2024-04-25 LAB — POCT PREGNANCY, URINE: Preg Test, Ur: NEGATIVE

## 2024-04-26 ENCOUNTER — Ambulatory Visit: Admitting: Obstetrics and Gynecology

## 2024-04-29 LAB — SURGICAL PATHOLOGY

## 2024-05-02 ENCOUNTER — Telehealth: Payer: Self-pay | Admitting: Clinical

## 2024-05-02 NOTE — Telephone Encounter (Signed)
Attempt call regarding referral; Left HIPPA-compliant message to call back Mikle Sternberg from [facility] at  [phone] (Ashelyn Mccravy's office).    

## 2024-05-03 ENCOUNTER — Ambulatory Visit: Payer: Self-pay | Admitting: Obstetrics and Gynecology

## 2024-07-10 ENCOUNTER — Telehealth: Payer: Self-pay | Admitting: Clinical

## 2024-07-10 NOTE — Telephone Encounter (Signed)
Attempt call regarding referral; Left HIPPA-compliant message to call back Mikle Sternberg from [facility] at  [phone] (Ashelyn Mccravy's office).    

## 2024-07-14 ENCOUNTER — Other Ambulatory Visit: Payer: Self-pay | Admitting: Hematology & Oncology

## 2024-07-14 DIAGNOSIS — I82431 Acute embolism and thrombosis of right popliteal vein: Secondary | ICD-10-CM
